# Patient Record
Sex: Female | Born: 1965
Health system: Southern US, Community
[De-identification: ages and names within clinical notes are randomized; demographics above are authoritative.]

## PROBLEM LIST (undated history)

## (undated) DIAGNOSIS — I2510 Atherosclerotic heart disease of native coronary artery without angina pectoris: Secondary | ICD-10-CM

## (undated) DIAGNOSIS — R7303 Prediabetes: Secondary | ICD-10-CM

## (undated) DIAGNOSIS — F32A Depression, unspecified: Secondary | ICD-10-CM

## (undated) DIAGNOSIS — M1990 Unspecified osteoarthritis, unspecified site: Secondary | ICD-10-CM

## (undated) DIAGNOSIS — G4730 Sleep apnea, unspecified: Secondary | ICD-10-CM

## (undated) DIAGNOSIS — I499 Cardiac arrhythmia, unspecified: Secondary | ICD-10-CM

## (undated) DIAGNOSIS — F419 Anxiety disorder, unspecified: Secondary | ICD-10-CM

## (undated) DIAGNOSIS — E119 Type 2 diabetes mellitus without complications: Secondary | ICD-10-CM

## (undated) DIAGNOSIS — I10 Essential (primary) hypertension: Secondary | ICD-10-CM

## (undated) HISTORY — PX: HERNIA REPAIR: SHX51

## (undated) HISTORY — PX: OTHER SURGICAL HISTORY: SHX169

## (undated) HISTORY — PX: BACK SURGERY: SHX140

---

## 1999-11-03 ENCOUNTER — Emergency Department (HOSPITAL_COMMUNITY): Admission: EM | Admit: 1999-11-03 | Discharge: 1999-11-03 | Payer: Self-pay | Admitting: *Deleted

## 2000-05-12 ENCOUNTER — Emergency Department (HOSPITAL_COMMUNITY): Admission: EM | Admit: 2000-05-12 | Discharge: 2000-05-12 | Payer: Self-pay | Admitting: Internal Medicine

## 2002-03-09 ENCOUNTER — Emergency Department (HOSPITAL_COMMUNITY): Admission: EM | Admit: 2002-03-09 | Discharge: 2002-03-09 | Payer: Self-pay | Admitting: Emergency Medicine

## 2002-03-09 ENCOUNTER — Encounter: Payer: Self-pay | Admitting: Emergency Medicine

## 2005-05-15 ENCOUNTER — Emergency Department (HOSPITAL_COMMUNITY): Admission: EM | Admit: 2005-05-15 | Discharge: 2005-05-15 | Payer: Self-pay | Admitting: Emergency Medicine

## 2005-06-10 ENCOUNTER — Ambulatory Visit (HOSPITAL_COMMUNITY): Admission: RE | Admit: 2005-06-10 | Discharge: 2005-06-10 | Payer: Self-pay | Admitting: Chiropractic Medicine

## 2011-01-11 ENCOUNTER — Observation Stay (HOSPITAL_COMMUNITY)
Admission: EM | Admit: 2011-01-11 | Discharge: 2011-01-13 | Disposition: A | Discharge status: Home / Self Care | Payer: Non-veteran care | Attending: Internal Medicine | Admitting: Internal Medicine

## 2011-01-11 ENCOUNTER — Emergency Department (HOSPITAL_COMMUNITY): Payer: Non-veteran care

## 2011-01-11 DIAGNOSIS — E785 Hyperlipidemia, unspecified: Secondary | ICD-10-CM | POA: Insufficient documentation

## 2011-01-11 DIAGNOSIS — M545 Low back pain, unspecified: Secondary | ICD-10-CM | POA: Insufficient documentation

## 2011-01-11 DIAGNOSIS — K5900 Constipation, unspecified: Secondary | ICD-10-CM | POA: Insufficient documentation

## 2011-01-11 DIAGNOSIS — R0789 Other chest pain: Principal | ICD-10-CM | POA: Insufficient documentation

## 2011-01-11 DIAGNOSIS — D509 Iron deficiency anemia, unspecified: Secondary | ICD-10-CM | POA: Insufficient documentation

## 2011-01-11 DIAGNOSIS — J309 Allergic rhinitis, unspecified: Secondary | ICD-10-CM | POA: Insufficient documentation

## 2011-01-11 DIAGNOSIS — R0602 Shortness of breath: Secondary | ICD-10-CM | POA: Insufficient documentation

## 2011-01-11 DIAGNOSIS — E876 Hypokalemia: Secondary | ICD-10-CM | POA: Insufficient documentation

## 2011-01-11 DIAGNOSIS — F329 Major depressive disorder, single episode, unspecified: Secondary | ICD-10-CM | POA: Insufficient documentation

## 2011-01-11 DIAGNOSIS — G4700 Insomnia, unspecified: Secondary | ICD-10-CM | POA: Insufficient documentation

## 2011-01-11 DIAGNOSIS — F3289 Other specified depressive episodes: Secondary | ICD-10-CM | POA: Insufficient documentation

## 2011-01-11 DIAGNOSIS — R002 Palpitations: Secondary | ICD-10-CM | POA: Insufficient documentation

## 2011-01-11 LAB — CBC
HCT: 33.3 % — ABNORMAL LOW (ref 36.0–46.0)
Hemoglobin: 10.7 g/dL — ABNORMAL LOW (ref 12.0–15.0)
MCH: 22.9 pg — ABNORMAL LOW (ref 26.0–34.0)
MCHC: 32.1 g/dL (ref 30.0–36.0)
MCV: 71.3 fL — ABNORMAL LOW (ref 78.0–100.0)
Platelets: 430 10*3/uL — ABNORMAL HIGH (ref 150–400)
RBC: 4.67 MIL/uL (ref 3.87–5.11)
RDW: 17.9 % — ABNORMAL HIGH (ref 11.5–15.5)
WBC: 6.1 10*3/uL (ref 4.0–10.5)

## 2011-01-11 LAB — D-DIMER, QUANTITATIVE: D-Dimer, Quant: 0.49 ug/mL-FEU — ABNORMAL HIGH (ref 0.00–0.48)

## 2011-01-11 LAB — CK TOTAL AND CKMB (NOT AT ARMC)
CK, MB: 1.1 ng/mL (ref 0.3–4.0)
Relative Index: 0.8 (ref 0.0–2.5)
Total CK: 136 U/L (ref 7–177)

## 2011-01-11 LAB — COMPREHENSIVE METABOLIC PANEL
ALT: 16 U/L (ref 0–35)
AST: 21 U/L (ref 0–37)
Albumin: 3.5 g/dL (ref 3.5–5.2)
Alkaline Phosphatase: 107 U/L (ref 39–117)
BUN: 9 mg/dL (ref 6–23)
CO2: 26 mEq/L (ref 19–32)
Calcium: 9.3 mg/dL (ref 8.4–10.5)
Chloride: 105 mEq/L (ref 96–112)
Creatinine, Ser: 0.92 mg/dL (ref 0.4–1.2)
GFR calc Af Amer: >60 mL/min (ref >60)
GFR calc non Af Amer: >60 mL/min (ref >60)
Glucose, Bld: 89 mg/dL (ref 70–99)
Potassium: 3.3 mEq/L — ABNORMAL LOW (ref 3.5–5.1)
Sodium: 137 mEq/L (ref 135–145)
Total Bilirubin: 0.2 mg/dL — ABNORMAL LOW (ref 0.3–1.2)
Total Protein: 7.1 g/dL (ref 6.0–8.3)

## 2011-01-11 LAB — POCT CARDIAC MARKERS
CKMB, poc: <1 ng/mL — ABNORMAL LOW (ref 1.0–8.0)
Myoglobin, poc: 125 ng/mL (ref 12–200)
Troponin i, poc: <0.05 ng/mL (ref 0.00–0.09)

## 2011-01-11 LAB — DIFFERENTIAL
Basophils Absolute: 0 10*3/uL (ref 0.0–0.1)
Basophils Relative: 0 % (ref 0–1)
Eosinophils Absolute: 0.2 10*3/uL (ref 0.0–0.7)
Eosinophils Relative: 4 % (ref 0–5)
Lymphocytes Relative: 31 % (ref 12–46)
Lymphs Abs: 1.9 10*3/uL (ref 0.7–4.0)
Monocytes Absolute: 0.6 10*3/uL (ref 0.1–1.0)
Monocytes Relative: 10 % (ref 3–12)
Neutro Abs: 3.4 10*3/uL (ref 1.7–7.7)
Neutrophils Relative %: 56 % (ref 43–77)

## 2011-01-11 LAB — TROPONIN I: Troponin I: 0.01 ng/mL (ref 0.00–0.06)

## 2011-01-12 LAB — URINALYSIS, ROUTINE W REFLEX MICROSCOPIC
Bilirubin Urine: NEGATIVE
Glucose, UA: NEGATIVE mg/dL
Hgb urine dipstick: NEGATIVE
Ketones, ur: NEGATIVE mg/dL
Nitrite: NEGATIVE
Protein, ur: NEGATIVE mg/dL
Specific Gravity, Urine: 1.012 (ref 1.005–1.030)
Urobilinogen, UA: 1 mg/dL (ref 0.0–1.0)
pH: 6.5 (ref 5.0–8.0)

## 2011-01-12 LAB — URINE MICROSCOPIC-ADD ON

## 2011-01-12 LAB — HEMOGLOBIN A1C
Hgb A1c MFr Bld: 6 % — ABNORMAL HIGH (ref <5.7)
Mean Plasma Glucose: 126 mg/dL — ABNORMAL HIGH (ref <117)

## 2011-01-12 LAB — PROTIME-INR
INR: 1.04 (ref 0.00–1.49)
Prothrombin Time: 13.8 seconds (ref 11.6–15.2)

## 2011-01-12 LAB — CBC
HCT: 31.9 % — ABNORMAL LOW (ref 36.0–46.0)
Hemoglobin: 10.1 g/dL — ABNORMAL LOW (ref 12.0–15.0)
MCH: 22.5 pg — ABNORMAL LOW (ref 26.0–34.0)
MCHC: 31.7 g/dL (ref 30.0–36.0)
MCV: 71 fL — ABNORMAL LOW (ref 78.0–100.0)
Platelets: 391 10*3/uL (ref 150–400)
RBC: 4.49 MIL/uL (ref 3.87–5.11)
RDW: 17.9 % — ABNORMAL HIGH (ref 11.5–15.5)
WBC: 6.1 10*3/uL (ref 4.0–10.5)

## 2011-01-12 LAB — LIPID PANEL
Cholesterol: 227 mg/dL — ABNORMAL HIGH (ref 0–200)
HDL: 48 mg/dL (ref >39)
LDL Cholesterol: 122 mg/dL — ABNORMAL HIGH (ref 0–99)
Total CHOL/HDL Ratio: 4.7 RATIO
Triglycerides: 283 mg/dL — ABNORMAL HIGH (ref <150)
VLDL: 57 mg/dL — ABNORMAL HIGH (ref 0–40)

## 2011-01-12 LAB — CK TOTAL AND CKMB (NOT AT ARMC)
CK, MB: 1.7 ng/mL (ref 0.3–4.0)
CK, MB: 2 ng/mL (ref 0.3–4.0)
Relative Index: 1 (ref 0.0–2.5)
Relative Index: 1.1 (ref 0.0–2.5)
Total CK: 165 U/L (ref 7–177)
Total CK: 187 U/L — ABNORMAL HIGH (ref 7–177)

## 2011-01-12 LAB — MAGNESIUM: Magnesium: 2.1 mg/dL (ref 1.5–2.5)

## 2011-01-12 LAB — BASIC METABOLIC PANEL
BUN: 8 mg/dL (ref 6–23)
CO2: 27 mEq/L (ref 19–32)
Calcium: 8.9 mg/dL (ref 8.4–10.5)
Chloride: 105 mEq/L (ref 96–112)
Creatinine, Ser: 1 mg/dL (ref 0.4–1.2)
GFR calc Af Amer: >60 mL/min (ref >60)
GFR calc non Af Amer: 60 mL/min — ABNORMAL LOW (ref >60)
Glucose, Bld: 103 mg/dL — ABNORMAL HIGH (ref 70–99)
Potassium: 3.2 mEq/L — ABNORMAL LOW (ref 3.5–5.1)
Sodium: 137 mEq/L (ref 135–145)

## 2011-01-12 LAB — TSH: TSH: 3.611 u[IU]/mL (ref 0.350–4.500)

## 2011-01-12 LAB — TROPONIN I
Troponin I: 0.01 ng/mL (ref 0.00–0.06)
Troponin I: 0.01 ng/mL (ref 0.00–0.06)

## 2011-01-12 LAB — PHOSPHORUS: Phosphorus: 3.1 mg/dL (ref 2.3–4.6)

## 2011-01-12 LAB — APTT: aPTT: 32 seconds (ref 24–37)

## 2011-01-13 LAB — BASIC METABOLIC PANEL
BUN: 6 mg/dL (ref 6–23)
CO2: 27 mEq/L (ref 19–32)
Calcium: 9.3 mg/dL (ref 8.4–10.5)
Chloride: 105 mEq/L (ref 96–112)
Creatinine, Ser: 0.86 mg/dL (ref 0.4–1.2)
GFR calc Af Amer: >60 mL/min (ref >60)
GFR calc non Af Amer: >60 mL/min (ref >60)
Glucose, Bld: 91 mg/dL (ref 70–99)
Potassium: 4.5 mEq/L (ref 3.5–5.1)
Sodium: 136 mEq/L (ref 135–145)

## 2011-01-29 NOTE — H&P (Signed)
NAME:  Holly Cook, Holly Cook                 ACCOUNT NO.:  [id number]  MEDICAL RECORD NO.:  [id number]           PATIENT TYPE:  E  LOCATION:  WLED                         FACILITY[facility]  [facility]  PHYSICIAN:  Michiel Cowboy, MDDATE OF BIRTH:  24-Jan-1966  DATE OF ADMISSION:  01/11/2011 DATE OF DISCHARGE:                             HISTORY & PHYSICAL   PRIMARY CARE PHYSICIAN:  The goes to [place].  CHIEF COMPLAINT:  Chest pain, palpitations and back pain.  HISTORY OF PRESENT ILLNESS:  The patient is a 45 year old female who today developed intermittent chest pains feeling like twinges in her chest each lasting a couple of minutes, but a few presenting in the row as well as associated some palpitations.  This was also associated with minimal shortness of breath, mild nausea, otherwise no diaphoresis. This was nonexertional, nonpleuritic.  Currently, the patient is completely chest pain-free and asymptomatic.  She also reports having today lower back pain on the left.  She denies lifting anything and this pain has been relieved with morphine.  Otherwise, no other complaints. The patient is chest pain-free apparently currently.  REVIEW OF SYSTEMS:  Unremarkable.  PAST MEDICAL HISTORY:  Significant for cervical versus uterine abnormality which the patient is not sure of, followed by [facility] for which she is on hormonal therapy.  ALLERGIES:  She has no allergies.  SOCIAL HISTORY:  The patient does not smoke or drink, never does.  Does not abuse drugs.  FAMILY HISTORY:  Significant for mother with lung cancer who was a heavy smoker.  ALLERGIES:  None.  MEDICATIONS:  Vitamin D and some analgesia and some hormonal medication which she does not know what the name of that is but thinks this is not birth control pill.  PHYSICAL EXAMINATION:  VITALS:  Temperature 98.4, blood pressure 122/81, pulse 72, respiration 19, satting 97% on room air. GENERAL:  The patient appears to be in  no acute distress.  Head nontraumatic.  Moist mucous membranes. LUNGS:  Clear to auscultation bilaterally. HEART:  Regular rate and rhythm.  No murmurs appreciated. ABDOMEN:  Soft, nontender, nondistended.  Lower extremities without clubbing, cyanosis or edema.  NEUROLOGICALLY:  Grossly intact. SKIN:  Clean, dry and intact.  LABORATORY DATA:  White blood cell count 6.1, hemoglobin 10.7.  Sodium 137, potassium 3.3, creatinine 0.92.  LFTs within normal limits. Cardiac markers within normal limits.  Chest x-ray unremarkable.  EKG does show T-wave inversion in multiple leads including I, II, III, aVF, V1 through V6.  She does also have some S waves in lead III.  ASSESSMENT/PLAN:  This is a 45 year old female with atypical chest pain and slightly abnormal EKG with some palpitations and possible chest pain associated with shortness of breath. 1. Chest pain, this is very atypical but given that we do not have an     old EKG to compare, we will admit for observation, cycle cardiac     enzymes, further risk stratify fasting lipid panel, hemoglobin A1c,     serial EKG.  We will check a D-dimer given some EKG changes     associated with shortness of  breath with chest pain, although her     chest pain is very atypical for a PE as well.  The patient is     undergoing some kind of hormone replacement or some kind of     hormonal therapy which can also put her at slightly increased risk     for PEs.  We will need to clarify what that is. 2. Slightly low potassium, replaced. 3. Prophylaxis, Lovenox and good p.o. intake.     Michiel Cowboy, MD     AVD/MEDQ  D:  01/11/2011  T:  01/11/2011  Job:  [id number]  cc:   Renne Musca  Electronically Signed by Therisa Doyne MD on 01/29/2011 02:38:58 AM

## 2011-08-15 ENCOUNTER — Emergency Department (HOSPITAL_COMMUNITY)
Admission: EM | Admit: 2011-08-15 | Discharge: 2011-08-15 | Disposition: A | Discharge status: Home / Self Care | Payer: Non-veteran care | Attending: Emergency Medicine | Admitting: Emergency Medicine

## 2011-08-15 ENCOUNTER — Emergency Department (HOSPITAL_COMMUNITY): Payer: Non-veteran care

## 2011-08-15 DIAGNOSIS — N200 Calculus of kidney: Secondary | ICD-10-CM | POA: Insufficient documentation

## 2011-08-15 LAB — URINALYSIS, ROUTINE W REFLEX MICROSCOPIC
Bilirubin Urine: NEGATIVE
Glucose, UA: NEGATIVE mg/dL
Ketones, ur: NEGATIVE mg/dL
Nitrite: NEGATIVE
Protein, ur: NEGATIVE mg/dL
Specific Gravity, Urine: 1.019 (ref 1.005–1.030)
Urobilinogen, UA: 1 mg/dL (ref 0.0–1.0)
pH: 7.5 (ref 5.0–8.0)

## 2011-08-15 LAB — BASIC METABOLIC PANEL
BUN: 11 mg/dL (ref 6–23)
CO2: 25 mEq/L (ref 19–32)
Calcium: 9.4 mg/dL (ref 8.4–10.5)
Chloride: 103 mEq/L (ref 96–112)
Creatinine, Ser: 1.27 mg/dL — ABNORMAL HIGH (ref 0.50–1.10)
GFR calc Af Amer: 58 mL/min — ABNORMAL LOW (ref >90)
GFR calc non Af Amer: 50 mL/min — ABNORMAL LOW (ref >90)
Glucose, Bld: 108 mg/dL — ABNORMAL HIGH (ref 70–99)
Potassium: 4 mEq/L (ref 3.5–5.1)
Sodium: 138 mEq/L (ref 135–145)

## 2011-08-15 LAB — URINE MICROSCOPIC-ADD ON

## 2011-08-15 LAB — CBC
HCT: 34.1 % — ABNORMAL LOW (ref 36.0–46.0)
Hemoglobin: 11.7 g/dL — ABNORMAL LOW (ref 12.0–15.0)
MCH: 24.9 pg — ABNORMAL LOW (ref 26.0–34.0)
MCHC: 34.3 g/dL (ref 30.0–36.0)
MCV: 72.6 fL — ABNORMAL LOW (ref 78.0–100.0)
Platelets: 417 10*3/uL — ABNORMAL HIGH (ref 150–400)
RBC: 4.7 MIL/uL (ref 3.87–5.11)
RDW: 17.7 % — ABNORMAL HIGH (ref 11.5–15.5)
WBC: 7.3 10*3/uL (ref 4.0–10.5)

## 2011-08-15 LAB — DIFFERENTIAL
Basophils Absolute: 0 10*3/uL (ref 0.0–0.1)
Basophils Relative: 0 % (ref 0–1)
Eosinophils Absolute: 0.5 10*3/uL (ref 0.0–0.7)
Eosinophils Relative: 6 % — ABNORMAL HIGH (ref 0–5)
Lymphocytes Relative: 21 % (ref 12–46)
Lymphs Abs: 1.6 10*3/uL (ref 0.7–4.0)
Monocytes Absolute: 0.5 10*3/uL (ref 0.1–1.0)
Monocytes Relative: 6 % (ref 3–12)
Neutro Abs: 4.8 10*3/uL (ref 1.7–7.7)
Neutrophils Relative %: 66 % (ref 43–77)

## 2011-08-15 LAB — POCT PREGNANCY, URINE: Preg Test, Ur: NEGATIVE

## 2011-08-24 ENCOUNTER — Emergency Department (HOSPITAL_COMMUNITY)
Admission: EM | Admit: 2011-08-24 | Discharge: 2011-08-25 | Disposition: A | Discharge status: Home / Self Care | Payer: Non-veteran care | Attending: Emergency Medicine | Admitting: Emergency Medicine

## 2011-08-24 DIAGNOSIS — R109 Unspecified abdominal pain: Secondary | ICD-10-CM | POA: Insufficient documentation

## 2011-08-24 DIAGNOSIS — N72 Inflammatory disease of cervix uteri: Secondary | ICD-10-CM | POA: Insufficient documentation

## 2011-08-24 DIAGNOSIS — K5900 Constipation, unspecified: Secondary | ICD-10-CM | POA: Insufficient documentation

## 2011-08-25 ENCOUNTER — Emergency Department (HOSPITAL_COMMUNITY): Payer: Non-veteran care

## 2011-08-25 LAB — POCT I-STAT, CHEM 8
BUN: 7 mg/dL (ref 6–23)
Calcium, Ion: 1.17 mmol/L (ref 1.12–1.32)
Chloride: 104 mEq/L (ref 96–112)
Creatinine, Ser: 1.1 mg/dL (ref 0.50–1.10)
Glucose, Bld: 101 mg/dL — ABNORMAL HIGH (ref 70–99)
HCT: 40 % (ref 36.0–46.0)
Hemoglobin: 13.6 g/dL (ref 12.0–15.0)
Potassium: 3.9 mEq/L (ref 3.5–5.1)
Sodium: 138 mEq/L (ref 135–145)
TCO2: 24 mmol/L (ref 0–100)

## 2011-08-25 LAB — CBC
HCT: 34.9 % — ABNORMAL LOW (ref 36.0–46.0)
Hemoglobin: 11.5 g/dL — ABNORMAL LOW (ref 12.0–15.0)
MCH: 24.2 pg — ABNORMAL LOW (ref 26.0–34.0)
MCHC: 33 g/dL (ref 30.0–36.0)
MCV: 73.3 fL — ABNORMAL LOW (ref 78.0–100.0)
Platelets: 415 10*3/uL — ABNORMAL HIGH (ref 150–400)
RBC: 4.76 MIL/uL (ref 3.87–5.11)
RDW: 17.8 % — ABNORMAL HIGH (ref 11.5–15.5)
WBC: 9.3 10*3/uL (ref 4.0–10.5)

## 2011-08-25 LAB — DIFFERENTIAL
Basophils Absolute: 0 10*3/uL (ref 0.0–0.1)
Basophils Relative: 0 % (ref 0–1)
Eosinophils Absolute: 0.5 10*3/uL (ref 0.0–0.7)
Eosinophils Relative: 6 % — ABNORMAL HIGH (ref 0–5)
Lymphocytes Relative: 20 % (ref 12–46)
Lymphs Abs: 1.9 10*3/uL (ref 0.7–4.0)
Monocytes Absolute: 0.8 10*3/uL (ref 0.1–1.0)
Monocytes Relative: 9 % (ref 3–12)
Neutro Abs: 6.1 10*3/uL (ref 1.7–7.7)
Neutrophils Relative %: 65 % (ref 43–77)

## 2011-08-25 LAB — WET PREP, GENITAL
Trich, Wet Prep: NONE SEEN
Yeast Wet Prep HPF POC: NONE SEEN

## 2011-08-25 LAB — URINALYSIS, ROUTINE W REFLEX MICROSCOPIC
Bilirubin Urine: NEGATIVE
Glucose, UA: NEGATIVE mg/dL
Ketones, ur: NEGATIVE mg/dL
Leukocytes, UA: NEGATIVE
Nitrite: NEGATIVE
Protein, ur: NEGATIVE mg/dL
Specific Gravity, Urine: 1.017 (ref 1.005–1.030)
Urobilinogen, UA: 1 mg/dL (ref 0.0–1.0)
pH: 6 (ref 5.0–8.0)

## 2011-08-25 LAB — URINE MICROSCOPIC-ADD ON

## 2011-08-25 LAB — POCT PREGNANCY, URINE: Preg Test, Ur: NEGATIVE

## 2011-08-26 LAB — GC/CHLAMYDIA PROBE AMP, GENITAL
Chlamydia, DNA Probe: NEGATIVE
GC Probe Amp, Genital: NEGATIVE

## 2012-01-11 ENCOUNTER — Encounter (HOSPITAL_COMMUNITY): Payer: Self-pay | Admitting: *Deleted

## 2012-01-11 ENCOUNTER — Emergency Department (HOSPITAL_COMMUNITY)
Admission: EM | Admit: 2012-01-11 | Discharge: 2012-01-11 | Disposition: A | Discharge status: Home / Self Care | Payer: Non-veteran care | Attending: Emergency Medicine | Admitting: Emergency Medicine

## 2012-01-11 DIAGNOSIS — T50A95A Adverse effect of other bacterial vaccines, initial encounter: Secondary | ICD-10-CM | POA: Insufficient documentation

## 2012-01-11 DIAGNOSIS — M25519 Pain in unspecified shoulder: Secondary | ICD-10-CM | POA: Insufficient documentation

## 2012-01-11 DIAGNOSIS — M7989 Other specified soft tissue disorders: Secondary | ICD-10-CM | POA: Insufficient documentation

## 2012-01-11 MED ORDER — CEPHALEXIN 500 MG PO CAPS: 500.0000 mg | ORAL_CAPSULE | Freq: Four times a day (QID) | ORAL | Status: AC

## 2012-01-11 MED ORDER — HYDROCODONE-ACETAMINOPHEN 5-325 MG PO TABS: 1.0000 | ORAL_TABLET | Freq: Four times a day (QID) | ORAL | Status: AC | PRN

## 2012-01-11 NOTE — ED Notes (Signed)
[facility]  She has had redness and swelling to the lt arm injection site.  Painful red

## 2012-01-11 NOTE — ED Provider Notes (Signed)
History     CSN: [id number]  Arrival date & time 01/11/12  Paulo Fruit   First MD Initiated Contact with Patient 01/11/12 2151      Chief Complaint  Patient presents with  . injection reaction     HPI Patient presents to the emergency room with complaints of left shoulder pain associated with a tetanus injection. Patient received a shot on Thursday at the [place]. Patient has developed swelling and tenderness at the site. Patient became concerned that she started having chills, and myalgias. She has felt feverish as well. The site has continued to be very swollen and tender. She describes the pain as a 9/10. It increases with palpation and movement. History reviewed. No pertinent past medical history.  History reviewed. No pertinent past surgical history.  History reviewed. No pertinent family history.  History  Substance Use Topics  . Smoking status: Never Smoker   . Smokeless tobacco: Not on file  . Alcohol Use: No    OB History    Grav Para Term Preterm Abortions TAB SAB Ect Mult Living                  Review of Systems  All other systems reviewed and are negative.    Allergies  Tetanus toxoids  Home Medications  No current outpatient prescriptions on file.  BP 124/86  Pulse 80  Temp(Src) 98.4 F (36.9 C) (Oral)  Resp 22  SpO2 98%  Physical Exam  Nursing note and vitals reviewed. Constitutional: She appears well-developed and well-nourished. No distress.  HENT:  Head: Normocephalic and atraumatic.  Right Ear: External ear normal.  Left Ear: External ear normal.  Eyes: Conjunctivae are normal. Right eye exhibits no discharge. Left eye exhibits no discharge. No scleral icterus.  Neck: Neck supple. No tracheal deviation present.  Cardiovascular: Normal rate.   Pulmonary/Chest: Effort normal. No stridor. No respiratory distress.  Musculoskeletal: She exhibits edema and tenderness.       Left shoulder: She exhibits tenderness and swelling. She exhibits no bony  tenderness, no crepitus and normal pulse.       Indurated area left deltoid area, erythema, no lymphangitic streaking  Neurological: She is alert. Cranial nerve deficit: no gross deficits.  Skin: Skin is warm and dry. No rash noted.  Psychiatric: She has a normal mood and affect.    ED Course  Procedures (including critical care time)  Labs Reviewed - No data to display No results found.    MDM  I doubt a true allergic reaction associated with her tetanus shot.  I suspect she primarily is having a significant localized reaction. It has been 5 days since the injection. Overall infection is low however with her complaints of chills and do not feel that there is any significant harm and started on a course of Keflex. I will prescribe pain medications I recommended she continue warm compresses.        Celene Kras, MD 01/11/12 [phone]

## 2012-01-11 NOTE — ED Notes (Signed)
Pt instructed on observing s/s of infection and enlargement of the wound bed.

## 2012-05-11 ENCOUNTER — Emergency Department (HOSPITAL_COMMUNITY)
Admission: EM | Admit: 2012-05-11 | Discharge: 2012-05-12 | Disposition: A | Discharge status: Home / Self Care | Payer: Non-veteran care | Attending: Emergency Medicine | Admitting: Emergency Medicine

## 2012-05-11 ENCOUNTER — Encounter (HOSPITAL_COMMUNITY): Payer: Self-pay | Admitting: Emergency Medicine

## 2012-05-11 DIAGNOSIS — M549 Dorsalgia, unspecified: Secondary | ICD-10-CM | POA: Insufficient documentation

## 2012-05-11 DIAGNOSIS — R29898 Other symptoms and signs involving the musculoskeletal system: Secondary | ICD-10-CM | POA: Insufficient documentation

## 2012-05-11 DIAGNOSIS — M48061 Spinal stenosis, lumbar region without neurogenic claudication: Secondary | ICD-10-CM

## 2012-05-11 DIAGNOSIS — R209 Unspecified disturbances of skin sensation: Secondary | ICD-10-CM | POA: Insufficient documentation

## 2012-05-11 LAB — CBC WITH DIFFERENTIAL/PLATELET
Basophils Absolute: 0 10*3/uL (ref 0.0–0.1)
Basophils Relative: 0 % (ref 0–1)
Eosinophils Absolute: 0.2 10*3/uL (ref 0.0–0.7)
Eosinophils Relative: 2 % (ref 0–5)
HCT: 37 % (ref 36.0–46.0)
Hemoglobin: 12.7 g/dL (ref 12.0–15.0)
Lymphocytes Relative: 25 % (ref 12–46)
Lymphs Abs: 1.9 10*3/uL (ref 0.7–4.0)
MCH: 25.9 pg — ABNORMAL LOW (ref 26.0–34.0)
MCHC: 34.3 g/dL (ref 30.0–36.0)
MCV: 75.5 fL — ABNORMAL LOW (ref 78.0–100.0)
Monocytes Absolute: 0.5 10*3/uL (ref 0.1–1.0)
Monocytes Relative: 6 % (ref 3–12)
Neutro Abs: 5.3 10*3/uL (ref 1.7–7.7)
Neutrophils Relative %: 67 % (ref 43–77)
Platelets: 403 10*3/uL — ABNORMAL HIGH (ref 150–400)
RBC: 4.9 MIL/uL (ref 3.87–5.11)
RDW: 16.2 % — ABNORMAL HIGH (ref 11.5–15.5)
WBC: 7.8 10*3/uL (ref 4.0–10.5)

## 2012-05-11 LAB — COMPREHENSIVE METABOLIC PANEL
ALT: 31 U/L (ref 0–35)
AST: 29 U/L (ref 0–37)
Albumin: 3.7 g/dL (ref 3.5–5.2)
Alkaline Phosphatase: 142 U/L — ABNORMAL HIGH (ref 39–117)
BUN: 11 mg/dL (ref 6–23)
CO2: 22 mEq/L (ref 19–32)
Calcium: 9.6 mg/dL (ref 8.4–10.5)
Chloride: 103 mEq/L (ref 96–112)
Creatinine, Ser: 0.95 mg/dL (ref 0.50–1.10)
GFR calc Af Amer: 81 mL/min — ABNORMAL LOW (ref >90)
GFR calc non Af Amer: 70 mL/min — ABNORMAL LOW (ref >90)
Glucose, Bld: 102 mg/dL — ABNORMAL HIGH (ref 70–99)
Potassium: 3.5 mEq/L (ref 3.5–5.1)
Sodium: 137 mEq/L (ref 135–145)
Total Bilirubin: 0.3 mg/dL (ref 0.3–1.2)
Total Protein: 7.7 g/dL (ref 6.0–8.3)

## 2012-05-11 MED ORDER — SODIUM CHLORIDE 0.9 % IV SOLN
Freq: Once | INTRAVENOUS | Status: AC
  Administered 2012-05-11: 22:00:00 via INTRAVENOUS

## 2012-05-11 NOTE — ED Notes (Signed)
XBJ:[id number]<NF> Expected date:05/11/12<BR> Expected time: 9:12 PM<BR> Means of arrival:Ambulance<BR> Comments:<BR> Leg weakness

## 2012-05-11 NOTE — ED Notes (Signed)
Brought in by EMS from home with c/o generalized weakness. Per EMS, pt has been having "tingling and numbness" in her lower extremities at least once a day for the past weeks, pt goes to [facility], pt has an appointment for MRI next week but she was advised that if her symptoms worsen, she needs to go to ED for evaluation. Pt reports that she has been having weakness more frequently--- today, when she was at her church, her legs suddenly got weak and almost fell--- took a rest and the "feeling" went away.

## 2012-05-12 ENCOUNTER — Emergency Department (HOSPITAL_COMMUNITY): Payer: Non-veteran care

## 2012-05-12 MED ORDER — OXYCODONE-ACETAMINOPHEN 10-325 MG PO TABS: 1.0000 | ORAL_TABLET | ORAL | Status: AC | PRN

## 2012-05-12 MED ORDER — DICLOFENAC SODIUM 75 MG PO TBEC
75.0000 mg | DELAYED_RELEASE_TABLET | Freq: Once | ORAL | Status: AC
  Administered 2012-05-12: 75 mg via ORAL
  Filled 2012-05-12: qty 1

## 2012-05-12 MED ORDER — CYCLOBENZAPRINE HCL 10 MG PO TABS
10.0000 mg | ORAL_TABLET | Freq: Once | ORAL | Status: AC
  Administered 2012-05-12: 10 mg via ORAL
  Filled 2012-05-12: qty 1

## 2012-05-12 MED ORDER — MORPHINE SULFATE 4 MG/ML IJ SOLN
4.0000 mg | Freq: Once | INTRAMUSCULAR | Status: AC
  Administered 2012-05-12: 4 mg via INTRAVENOUS
  Filled 2012-05-12: qty 1

## 2012-05-12 NOTE — ED Provider Notes (Signed)
BP 123/78  Pulse 69  Temp 98 F (36.7 C) (Oral)  Resp 12  SpO2 97%   Severe lumbar stenosis noted on MRI. Strength 5/5 and not c/o numbness at this time. Ambulatory in ED. D/W Dr. Venetia Maxon. Pain control, will f/u in office for surgical eval. No EMC precluding discharge at this time. Given Precautions for return. PMD f/u.   Forbes Cellar, MD 05/12/12 [phone]

## 2012-05-12 NOTE — ED Notes (Signed)
Patient transported to MRI 

## 2012-05-12 NOTE — ED Provider Notes (Signed)
History     CSN: [id number]  Arrival date & time 05/11/12  2111   First MD Initiated Contact with Patient 05/11/12 2300      Chief Complaint  Patient presents with  . Weakness    (Consider location/radiation/quality/duration/timing/severity/associated sxs/prior treatment) Patient is a 46 y.o. female presenting with weakness.  Weakness  Additional symptoms include weakness.   Patient is a 46 year old female who presents today complaining of one month of increasing frequency of episodes of bilateral lower extremity weakness and numbness. She has had some back pain and has history of a bulging disc but denies any acute injuries. She reports that her symptoms are always bilateral. She has been evaluated at the [place] where she normally follows up with a physician. While an MRI has been discussed none has been performed at this time. Patient reports the last time she was checked they felt that she was weaker in her right leg than her left. Patient says she's been having increasing episodes where her legs will not support her. These have never been accompanied by incontinence. Patient does describe numbness with these. She feels that the pain begins in her hips and radiates inferiorly. She has never had any numbness above the level of the umbilicus. Patient has no history of thromboembolic disease. There no other associated or modifying factors. History reviewed. No pertinent past medical history.  History reviewed. No pertinent past surgical history.  History reviewed. No pertinent family history.  History  Substance Use Topics  . Smoking status: Never Smoker   . Smokeless tobacco: Not on file  . Alcohol Use: No    OB History    Grav Para Term Preterm Abortions TAB SAB Ect Mult Living                  Review of Systems  HENT: Negative.   Eyes: Negative.   Respiratory: Negative.   Cardiovascular: Negative.   Gastrointestinal: Negative.   Genitourinary: Negative.     Musculoskeletal: Positive for back pain.  Skin: Negative.   Neurological: Positive for weakness and numbness.  Hematological: Negative.   Psychiatric/Behavioral: Negative.   All other systems reviewed and are negative.    Allergies  Tetanus toxoids  Home Medications   Current Outpatient Rx  Name Route Sig Dispense Refill  . VITAMIN E 400 UNITS PO CAPS Oral Take 400 Units by mouth daily.      BP 105/69  Pulse 74  Temp 98.1 F (36.7 C) (Oral)  Resp 13  SpO2 99%  Physical Exam  Nursing note and vitals reviewed. GEN: Well-developed, well-nourished female in no distress HEENT: Atraumatic, normocephalic. Oropharynx clear without erythema EYES: PERRLA BL, no scleral icterus. NECK: Trachea midline, no meningismus CV: regular rate and rhythm. No murmurs, rubs, or gallops PULM: No respiratory distress.  No crackles, wheezes, or rales. GI: soft, non-tender. No guarding, rebound, or tenderness. + bowel sounds  GU: deferred Neuro: cranial nerves grossly 2-12 intact, no abnormalities of strength or sensation, A and O x 3 MSK: Patient moves all 4 extremities symmetrically, no deformity, edema, or injury noted, patient is able to stand but reports that she feels weak. Skin: No rashes petechiae, purpura, or jaundice Psych: no abnormality of mood   ED Course  Procedures (including critical care time)  Labs Reviewed  CBC WITH DIFFERENTIAL - Abnormal; Notable for the following:    MCV 75.5 (*)     MCH 25.9 (*)     RDW 16.2 (*)     Platelets  403 (*)     All other components within normal limits  COMPREHENSIVE METABOLIC PANEL - Abnormal; Notable for the following:    Glucose, Bld 102 (*)     Alkaline Phosphatase 142 (*)     GFR calc non Af Amer 70 (*)     GFR calc Af Amer 81 (*)     All other components within normal limits   No results found.   1. Lower extremity weakness   2. Lower extremity numbness       MDM  Patient was evaluated by myself. Based on presentation  patient was not currently having any neurologic symptoms. She did report that she was having increasing episodes of weakness and bilateral lower shiny numbness. Patient had some back pain and bulging disc in the past. Patient denies any fevers, and all pain, urinary symptoms, history of thromboembolic disease, or other factors related to this. Patient did not 1 any pain medication other than her normal level tear and. Patient also takes Flexeril. She was given a dose of both of these. Patient had CBC and renal panel ordered per nursing with no significant abnormalities. Given the increasing frequency of these strange episodes patient did have MRI of thoracic and lumbar spine ordered. This is pending at this time. Should these return within normal limits patient can followup with go for neurological for further management. Any antibiotics will be followed up by the oncoming attending.        Cyndra Numbers, MD 05/12/12 0900

## 2012-05-12 NOTE — ED Notes (Signed)
Pt still in MRI 

## 2012-05-12 NOTE — ED Notes (Signed)
Pt getting dressed. Ready for discharge 

## 2012-07-24 ENCOUNTER — Encounter (HOSPITAL_COMMUNITY): Payer: Self-pay | Admitting: *Deleted

## 2012-07-24 ENCOUNTER — Inpatient Hospital Stay (HOSPITAL_COMMUNITY): Payer: Non-veteran care

## 2012-07-24 ENCOUNTER — Inpatient Hospital Stay (HOSPITAL_COMMUNITY)
Admission: AD | Admit: 2012-07-24 | Discharge: 2012-07-24 | Disposition: A | Discharge status: Home / Self Care | Payer: Non-veteran care | Source: Ambulatory Visit | Attending: Obstetrics and Gynecology | Admitting: Obstetrics and Gynecology

## 2012-07-24 DIAGNOSIS — N950 Postmenopausal bleeding: Secondary | ICD-10-CM

## 2012-07-24 DIAGNOSIS — D259 Leiomyoma of uterus, unspecified: Secondary | ICD-10-CM | POA: Insufficient documentation

## 2012-07-24 LAB — CBC
HCT: 36.6 % (ref 36.0–46.0)
Hemoglobin: 12.4 g/dL (ref 12.0–15.0)
MCH: 25.8 pg — ABNORMAL LOW (ref 26.0–34.0)
MCHC: 33.9 g/dL (ref 30.0–36.0)
MCV: 76.3 fL — ABNORMAL LOW (ref 78.0–100.0)
Platelets: 407 10*3/uL — ABNORMAL HIGH (ref 150–400)
RBC: 4.8 MIL/uL (ref 3.87–5.11)
RDW: 16 % — ABNORMAL HIGH (ref 11.5–15.5)
WBC: 7.2 10*3/uL (ref 4.0–10.5)

## 2012-07-24 LAB — WET PREP, GENITAL
Clue Cells Wet Prep HPF POC: NONE SEEN
Trich, Wet Prep: NONE SEEN
Yeast Wet Prep HPF POC: NONE SEEN

## 2012-07-24 LAB — POCT PREGNANCY, URINE: Preg Test, Ur: NEGATIVE

## 2012-07-24 MED ORDER — MEGESTROL ACETATE 40 MG PO TABS: ORAL_TABLET | ORAL | Status: DC

## 2012-07-24 MED ORDER — KETOROLAC TROMETHAMINE 60 MG/2ML IM SOLN
60.0000 mg | Freq: Once | INTRAMUSCULAR | Status: AC
  Administered 2012-07-24: 60 mg via INTRAMUSCULAR
  Filled 2012-07-24: qty 2

## 2012-07-24 MED ORDER — HYDROCODONE-ACETAMINOPHEN 5-325 MG PO TABS: 1.0000 | ORAL_TABLET | Freq: Four times a day (QID) | ORAL | Status: DC | PRN

## 2012-07-24 NOTE — MAU Note (Signed)
Pt reports she started having vaginal bleeding at 3am this morning. Pt has not had a period for 2 years(menapause).  Reports having menstral like cramping and pain.

## 2012-07-24 NOTE — MAU Provider Note (Signed)
History     CSN: [id number]  Arrival date and time: 07/24/12 1118   None     Chief Complaint  Patient presents with  . Vaginal Bleeding   HPI 46 y.o. [id number] with heavy vaginal bleeding since 3 am this morning. Pt states she is postmenopausal, has not had any vaginal bleeding x 2 years. Crampy low abd pain.     History reviewed. No pertinent past medical history.  Past Surgical History  Procedure Date  . Wrist and arm surgery   . Hernia repair     History reviewed. No pertinent family history.  History  Substance Use Topics  . Smoking status: Never Smoker   . Smokeless tobacco: Not on file  . Alcohol Use: No    Allergies:  Allergies  Allergen Reactions  . Tetanus Toxoids Other (See Comments)    Flu-like symptoms, injection site reaction; treated with antibiotics.  Not certain if reaction to vaccine or infection of injection site    No prescriptions prior to admission    Review of Systems  Constitutional: Negative.   Respiratory: Negative.   Cardiovascular: Negative.   Gastrointestinal: Positive for abdominal pain. Negative for nausea, vomiting, diarrhea and constipation.  Genitourinary: Negative for dysuria, urgency, frequency, hematuria and flank pain.       Positive for vaginal bleeding  Musculoskeletal: Negative.   Neurological: Negative.   Psychiatric/Behavioral: Negative.    Physical Exam   Blood pressure 135/94, pulse 67, temperature 97.1 F (36.2 C), temperature source Oral, resp. rate 18.  Physical Exam  Constitutional: She is oriented to person, place, and time. She appears well-developed and well-nourished. No distress.  HENT:  Head: Normocephalic and atraumatic.  Cardiovascular: Normal rate and regular rhythm.   Respiratory: Effort normal. No respiratory distress.  GI: Soft. She exhibits no distension and no mass. There is no tenderness. There is no rebound and no guarding.  Genitourinary: There is no rash or lesion on the right labia.  There is no rash or lesion on the left labia. Uterus is enlarged and tender. Uterus is not deviated and not fixed. Cervix exhibits no motion tenderness, no discharge and no friability. Right adnexum displays no mass, no tenderness and no fullness. Left adnexum displays no mass, no tenderness and no fullness. There is bleeding (moderate) around the vagina. No erythema or tenderness around the vagina. No vaginal discharge found.  Neurological: She is alert and oriented to person, place, and time.  Skin: Skin is warm and dry.  Psychiatric: She has a normal mood and affect.    MAU Course  Procedures Results for orders placed during the hospital encounter of 07/24/12 (from the past 24 hour(s))  POCT PREGNANCY, URINE     Status: Normal   Collection Time   07/24/12 11:49 AM      Component Value Range   Preg Test, Ur NEGATIVE  NEGATIVE  WET PREP, GENITAL     Status: Abnormal   Collection Time   07/24/12 12:00 PM      Component Value Range   Yeast Wet Prep HPF POC NONE SEEN  NONE SEEN   Trich, Wet Prep NONE SEEN  NONE SEEN   Clue Cells Wet Prep HPF POC NONE SEEN  NONE SEEN   WBC, Wet Prep HPF POC FEW (*) NONE SEEN  CBC     Status: Abnormal   Collection Time   07/24/12 12:27 PM      Component Value Range   WBC 7.2  4.0 - 10.5 K/uL  RBC 4.80  3.87 - 5.11 MIL/uL   Hemoglobin 12.4  12.0 - 15.0 g/dL   HCT 16.1  09.6 - 04.5 %   MCV 76.3 (*) 78.0 - 100.0 fL   MCH 25.8 (*) 26.0 - 34.0 pg   MCHC 33.9  30.0 - 36.0 g/dL   RDW 40.9 (*) 81.1 - 91.4 %   Platelets 407 (*) 150 - 400 K/uL   US Transvaginal Non-ob  07/24/2012  *RADIOLOGY REPORT*  Clinical Data: Post menopausal bleeding and pain.  TRANSABDOMINAL AND TRANSVAGINAL ULTRASOUND OF PELVIS Technique:  Both transabdominal and transvaginal ultrasound examinations of the pelvis were performed. Transabdominal technique was performed for global imaging of the pelvis including uterus, ovaries, adnexal regions, and pelvic cul-de-sac.  It was necessary to  proceed with endovaginal exam following the transabdominal exam to visualize the uterus and endometrium.  Comparison:  No priors.  Findings:  Uterus: The uterus is enlarged and heterogeneous in appearance measuring 11.0 x 6.5 x 8.4 cm.  There are multiple focal lesions of heterogeneous echotexture throughout the uterus, compatible with multifocal fibroids.  On the right, there are two lesions measuring 5.9 x 6.0 x 4.8 cm and 2.6 x 2.5 x 2.4 cm.  On the left, there are two lesions measuring 2.4 x 2.3 x 2.1 cm and 3.3 x 2.7 x 3.0 cm. In the lower uterine segment there is an additional lesion measuring 1.2 x 1.2 x 1.8 cm.  Endometrium: Endometrial stripe is borderline thickened measuring 15 mm.  Right ovary:  Could not be visualized.  Left ovary: Could not be visualized.  Other findings: No free fluid the cul-de-sac.  IMPRESSION: 1.  Fibroid uterus, as above.   Original Report Authenticated By: Florencia Reasons, M.D.    US Pelvis Complete  07/24/2012  *RADIOLOGY REPORT*  Clinical Data: Post menopausal bleeding and pain.  TRANSABDOMINAL AND TRANSVAGINAL ULTRASOUND OF PELVIS Technique:  Both transabdominal and transvaginal ultrasound examinations of the pelvis were performed. Transabdominal technique was performed for global imaging of the pelvis including uterus, ovaries, adnexal regions, and pelvic cul-de-sac.  It was necessary to proceed with endovaginal exam following the transabdominal exam to visualize the uterus and endometrium.  Comparison:  No priors.  Findings:  Uterus: The uterus is enlarged and heterogeneous in appearance measuring 11.0 x 6.5 x 8.4 cm.  There are multiple focal lesions of heterogeneous echotexture throughout the uterus, compatible with multifocal fibroids.  On the right, there are two lesions measuring 5.9 x 6.0 x 4.8 cm and 2.6 x 2.5 x 2.4 cm.  On the left, there are two lesions measuring 2.4 x 2.3 x 2.1 cm and 3.3 x 2.7 x 3.0 cm. In the lower uterine segment there is an additional  lesion measuring 1.2 x 1.2 x 1.8 cm.  Endometrium: Endometrial stripe is borderline thickened measuring 15 mm.  Right ovary:  Could not be visualized.  Left ovary: Could not be visualized.  Other findings: No free fluid the cul-de-sac.  IMPRESSION: 1.  Fibroid uterus, as above.   Original Report Authenticated By: Florencia Reasons, M.D.    Assessment and Plan   1. Fibroid uterus   2. Postmenopausal bleeding      Medication List     As of 07/24/2012  3:14 PM    START taking these medications         HYDROcodone-acetaminophen 5-325 MG per tablet   Commonly known as: NORCO/VICODIN   Take 1 tablet by mouth every 6 (six) hours as needed for  pain.      megestrol 40 MG tablet   Commonly known as: MEGACE   1 tab PO tid until bleeding stops, then 1 tab daily until seen for follow up      CONTINUE taking these medications         citalopram 40 MG tablet   Commonly known as: CELEXA      cyclobenzaprine 10 MG tablet   Commonly known as: FLEXERIL      diclofenac 75 MG EC tablet   Commonly known as: VOLTAREN      vitamin E 400 UNIT capsule   Generic drug: vitamin E          Where to get your medications    These are the prescriptions that you need to pick up.   You may get these medications from any pharmacy.         HYDROcodone-acetaminophen 5-325 MG per tablet   megestrol 40 MG tablet           Follow up with your own doctor as soon as possible Precautions rev'd  Brittanny Levenhagen 07/24/2012, 3:12 PM

## 2012-07-25 LAB — GC/CHLAMYDIA PROBE AMP, GENITAL
Chlamydia, DNA Probe: NEGATIVE
GC Probe Amp, Genital: NEGATIVE

## 2012-08-10 NOTE — MAU Provider Note (Signed)
Attestation of Attending Supervision of Advanced Practitioner: Evaluation and management procedures were performed by the PA/NP/CNM/OB Fellow under my supervision/collaboration. Chart reviewed and agree with management and plan.  Phil Michels V 08/10/2012 10:53 AM

## 2013-09-25 IMAGING — CR DG ABDOMEN ACUTE W/ 1V CHEST
4 series · 4 of 4 positions shown · non-contrast
Comparison: CT of the abdomen and pelvis performed 08/15/2011

CLINICAL DATA: Upper abdominal pain for 2 days; nausea.

ACUTE ABDOMEN SERIES (ABDOMEN 2 VIEW & CHEST 1 VIEW)

[w chest pa]
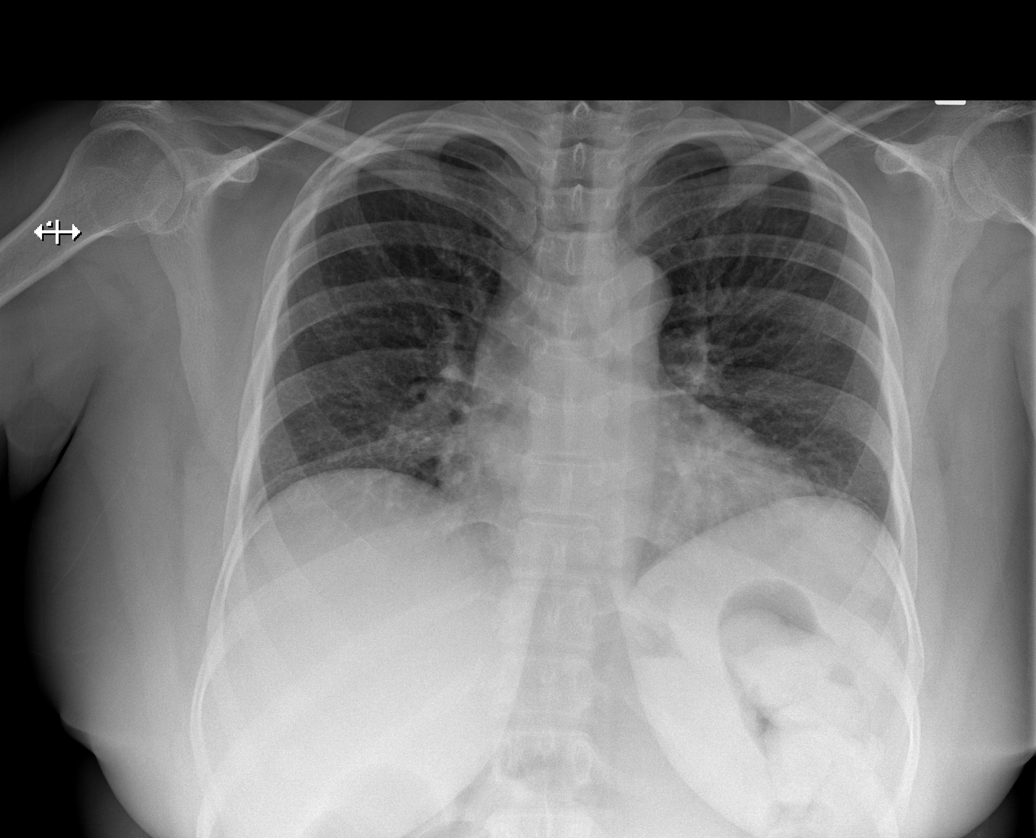

[w abdomen upright]
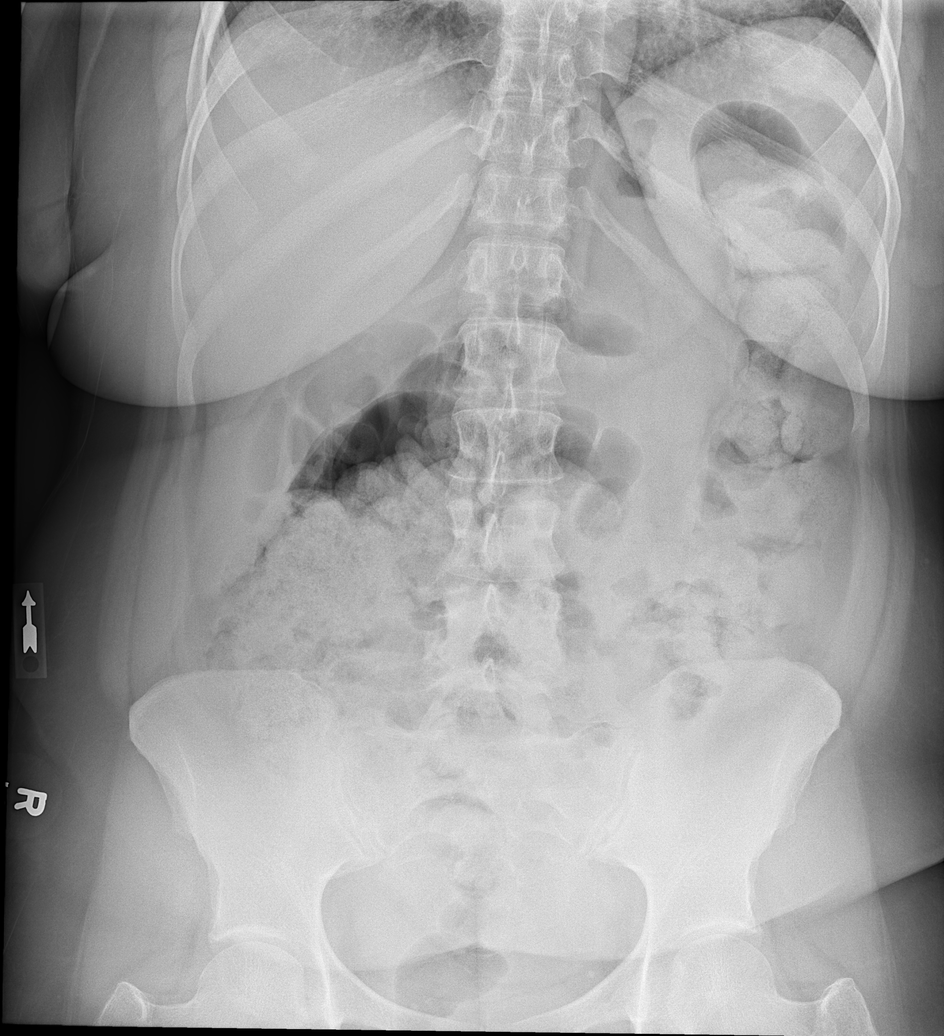

[t abdomen supine (1 of 2)]
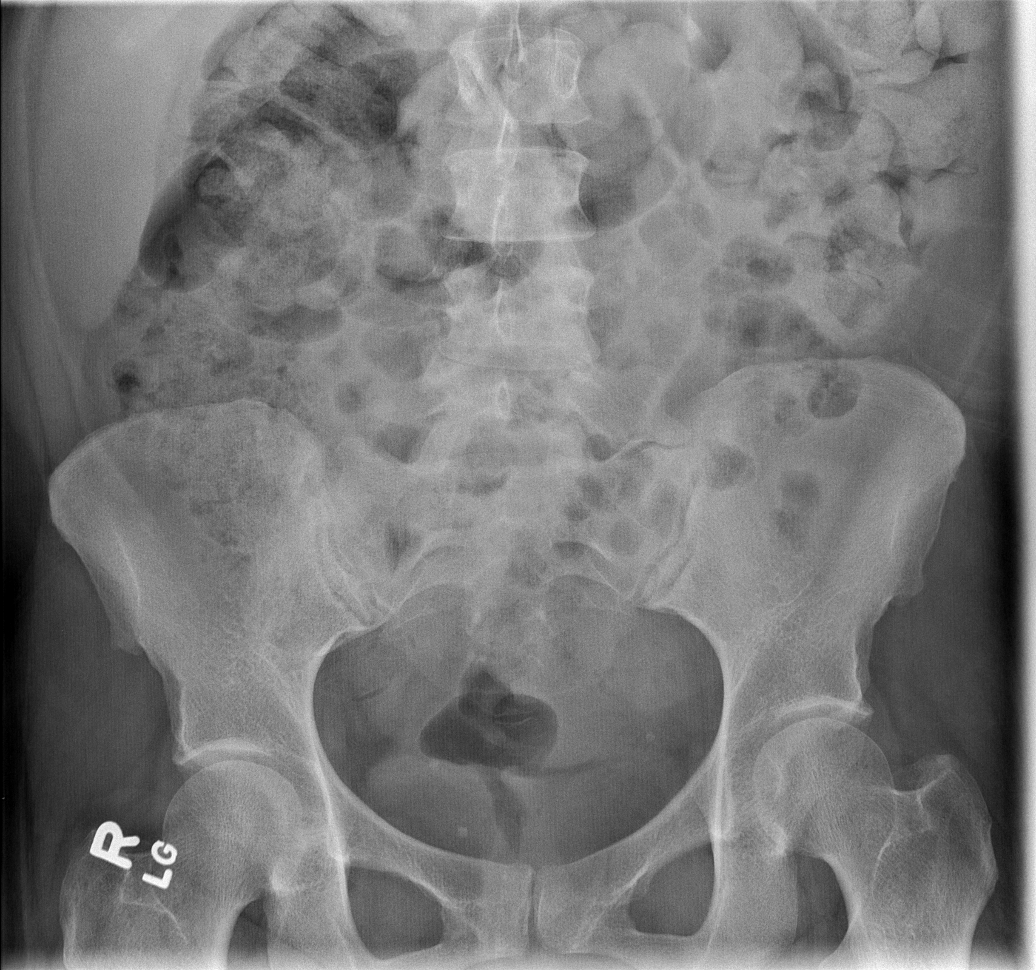

[t abdomen supine (2 of 2)]
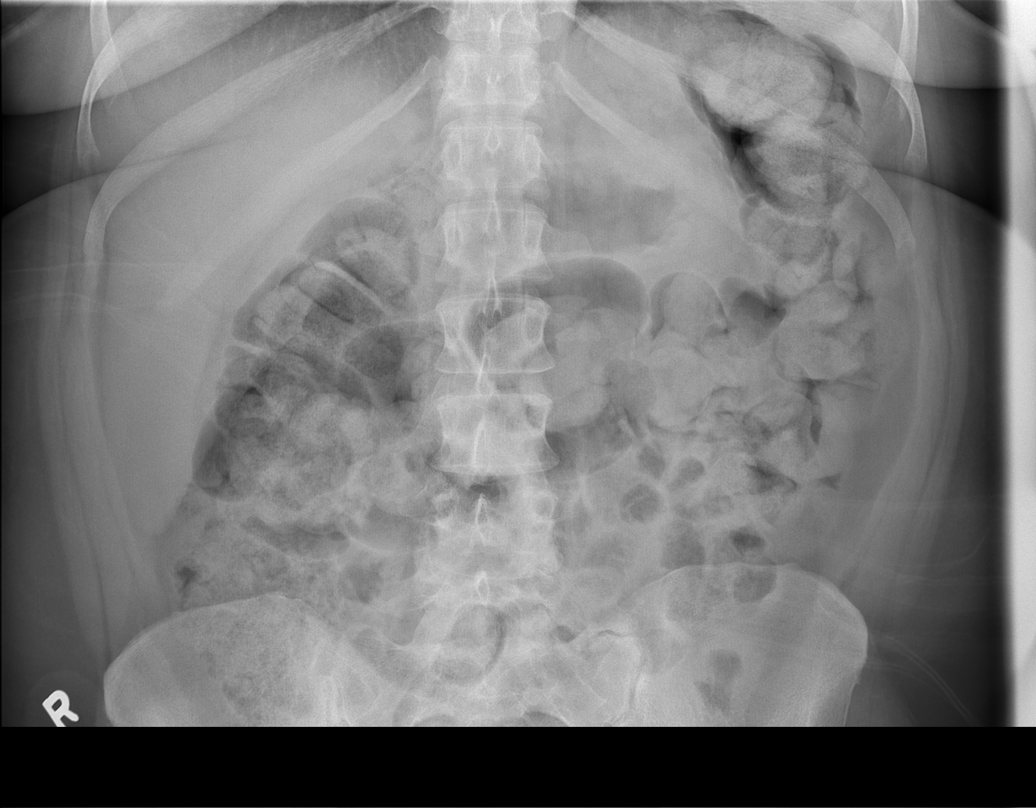

[4 of 4 positions shown; findings below may reference images not displayed]

FINDINGS: The lungs are hypoexpanded.  Mild vascular congestion and
vascular crowding are noted, without significant edema.  No pleural
effusion or pneumothorax is seen.  The cardiomediastinal silhouette
is borderline normal in size.

The visualized bowel gas pattern is unremarkable. Stool and air are
noted throughout the colon; there is no evidence of small bowel
dilatation to suggest obstruction.  A mildly increased stool burden
is noted.  No free intra-abdominal air is identified on the
provided upright view.

No acute osseous abnormalities are seen; the sacroiliac joints are
unremarkable in appearance.  There is partial sacralization of
vertebral body L5 on the left side.
IMPRESSION: 1.  Mild vascular congestion noted; lungs mildly hypoexpanded.  No
significant pulmonary edema seen.
2.  Unremarkable bowel gas pattern; no free intra-abdominal air
seen.  Mildly increased stool burden noted.
3.  Partial sacralization of vertebral body L5 on the left side,
with mild associated degenerative change.  In the appropriate
clinical situation, this could reflect Bertolotti's syndrome, and
can explain chronic lower back pain.

## 2013-10-08 ENCOUNTER — Emergency Department (HOSPITAL_COMMUNITY)
Admission: EM | Admit: 2013-10-08 | Discharge: 2013-10-09 | Disposition: A | Discharge status: Home / Self Care | Payer: Non-veteran care | Attending: Emergency Medicine | Admitting: Emergency Medicine

## 2013-10-08 ENCOUNTER — Emergency Department (HOSPITAL_COMMUNITY): Payer: Non-veteran care

## 2013-10-08 ENCOUNTER — Encounter (HOSPITAL_COMMUNITY): Payer: Self-pay | Admitting: Emergency Medicine

## 2013-10-08 DIAGNOSIS — Z9889 Other specified postprocedural states: Secondary | ICD-10-CM | POA: Insufficient documentation

## 2013-10-08 DIAGNOSIS — K5900 Constipation, unspecified: Secondary | ICD-10-CM | POA: Insufficient documentation

## 2013-10-08 DIAGNOSIS — Z791 Long term (current) use of non-steroidal anti-inflammatories (NSAID): Secondary | ICD-10-CM | POA: Insufficient documentation

## 2013-10-08 DIAGNOSIS — N390 Urinary tract infection, site not specified: Secondary | ICD-10-CM

## 2013-10-08 DIAGNOSIS — R110 Nausea: Secondary | ICD-10-CM | POA: Insufficient documentation

## 2013-10-08 DIAGNOSIS — Z79899 Other long term (current) drug therapy: Secondary | ICD-10-CM | POA: Insufficient documentation

## 2013-10-08 DIAGNOSIS — Z792 Long term (current) use of antibiotics: Secondary | ICD-10-CM | POA: Insufficient documentation

## 2013-10-08 DIAGNOSIS — E119 Type 2 diabetes mellitus without complications: Secondary | ICD-10-CM | POA: Insufficient documentation

## 2013-10-08 HISTORY — DX: Sleep apnea, unspecified: G47.30

## 2013-10-08 HISTORY — DX: Type 2 diabetes mellitus without complications: E11.9

## 2013-10-08 LAB — CBC WITH DIFFERENTIAL/PLATELET
Basophils Absolute: 0 10*3/uL (ref 0.0–0.1)
Basophils Relative: 0 % (ref 0–1)
Eosinophils Absolute: 0.2 10*3/uL (ref 0.0–0.7)
Eosinophils Relative: 2 % (ref 0–5)
HCT: 31.8 % — ABNORMAL LOW (ref 36.0–46.0)
Hemoglobin: 10.8 g/dL — ABNORMAL LOW (ref 12.0–15.0)
Lymphocytes Relative: 21 % (ref 12–46)
Lymphs Abs: 2 10*3/uL (ref 0.7–4.0)
MCH: 24 pg — ABNORMAL LOW (ref 26.0–34.0)
MCHC: 34 g/dL (ref 30.0–36.0)
MCV: 70.7 fL — ABNORMAL LOW (ref 78.0–100.0)
Monocytes Absolute: 0.4 10*3/uL (ref 0.1–1.0)
Monocytes Relative: 4 % (ref 3–12)
Neutro Abs: 6.9 10*3/uL (ref 1.7–7.7)
Neutrophils Relative %: 73 % (ref 43–77)
Platelets: 610 10*3/uL — ABNORMAL HIGH (ref 150–400)
RBC: 4.5 MIL/uL (ref 3.87–5.11)
RDW: 17.7 % — ABNORMAL HIGH (ref 11.5–15.5)
WBC: 9.4 10*3/uL (ref 4.0–10.5)

## 2013-10-08 LAB — BASIC METABOLIC PANEL
BUN: 9 mg/dL (ref 6–23)
CO2: 22 mEq/L (ref 19–32)
Calcium: 9.4 mg/dL (ref 8.4–10.5)
Chloride: 102 mEq/L (ref 96–112)
Creatinine, Ser: 0.79 mg/dL (ref 0.50–1.10)
GFR calc Af Amer: >90 mL/min (ref >90)
GFR calc non Af Amer: >90 mL/min (ref >90)
Glucose, Bld: 208 mg/dL — ABNORMAL HIGH (ref 70–99)
Potassium: 3.6 mEq/L (ref 3.5–5.1)
Sodium: 135 mEq/L (ref 135–145)

## 2013-10-08 MED ORDER — ONDANSETRON 8 MG PO TBDP: 8.0000 mg | ORAL_TABLET | Freq: Three times a day (TID) | ORAL | Status: DC | PRN

## 2013-10-08 MED ORDER — ONDANSETRON 8 MG PO TBDP
8.0000 mg | ORAL_TABLET | Freq: Once | ORAL | Status: AC
  Administered 2013-10-08: 8 mg via ORAL
  Filled 2013-10-08: qty 1

## 2013-10-08 MED ORDER — MAGNESIUM CITRATE PO SOLN: 0.5000 | Freq: Once | ORAL | Status: DC

## 2013-10-08 NOTE — ED Provider Notes (Signed)
CSN: [id number]     Arrival date & time 10/08/13  2108 History   First MD Initiated Contact with Patient 10/08/13 2210     Chief Complaint  Patient presents with  . Nausea    HPI Pt had surgery about a week ago at the [facility] for a lumbar spine fusion (L4-l5).  Pt left the hospital on Friday.  Today she devloped nause and felt flushed.  No vomiting,.  No fevers.  No increasing back pain.  No drainage from the wound.  No burning with urination.   No abdominal pain. Past Medical History  Diagnosis Date  . Diabetes mellitus without complication   . Sleep apnea    Past Surgical History  Procedure Laterality Date  . Wrist and arm surgery    . Hernia repair    . Back surgery     No family history on file. History  Substance Use Topics  . Smoking status: Never Smoker   . Smokeless tobacco: Not on file  . Alcohol Use: No   OB History   Grav Para Term Preterm Abortions TAB SAB Ect Mult Living   2 2 2  0 0 0 0 0 0 2     Review of Systems  All other systems reviewed and are negative.    Allergies  Tetanus toxoids  Home Medications   Current Outpatient Rx  Name  Route  Sig  Dispense  Refill  . cephALEXin (KEFLEX) 500 MG capsule   Oral   Take 1 capsule (500 mg total) by mouth 2 (two) times daily.   14 capsule   0   . citalopram (CELEXA) 40 MG tablet   Oral   Take 20 mg by mouth daily. Take one-half tablet         . cyclobenzaprine (FLEXERIL) 10 MG tablet   Oral   Take 10 mg by mouth 2 (two) times daily.         . diclofenac (VOLTAREN) 75 MG EC tablet   Oral   Take 75 mg by mouth daily.         Marland Kitchen HYDROcodone-acetaminophen (NORCO/VICODIN) 5-325 MG per tablet   Oral   Take 1 tablet by mouth every 6 (six) hours as needed for pain.   30 tablet   0   . magnesium citrate SOLN   Oral   Take 148 mLs (0.5 Bottles total) by mouth once.   195 mL   1   . megestrol (MEGACE) 40 MG tablet      1 tab PO tid until bleeding stops, then 1 tab daily until seen for follow  up   45 tablet   1   . ondansetron (ZOFRAN ODT) 8 MG disintegrating tablet   Oral   Take 1 tablet (8 mg total) by mouth every 8 (eight) hours as needed for nausea or vomiting.   20 tablet   0   . vitamin E (VITAMIN E) 400 UNIT capsule   Oral   Take 400 Units by mouth daily.          BP 131/83  Pulse 91  Temp(Src) 98.1 F (36.7 C) (Oral)  Resp 16  Ht 5\' 4"  (1.626 m)  Wt 250 lb (113.399 kg)  BMI 42.89 kg/m2  SpO2 97% Physical Exam  Nursing note and vitals reviewed. Constitutional: She appears well-developed and well-nourished. No distress.  HENT:  Head: Normocephalic and atraumatic.  Right Ear: External ear normal.  Left Ear: External ear normal.  Eyes: Conjunctivae are normal.  Right eye exhibits no discharge. Left eye exhibits no discharge. No scleral icterus.  Neck: Neck supple. No tracheal deviation present.  Cardiovascular: Normal rate, regular rhythm and intact distal pulses.   Pulmonary/Chest: Effort normal and breath sounds normal. No stridor. No respiratory distress. She has no wheezes. She has no rales.  Abdominal: Soft. Bowel sounds are normal. She exhibits no distension. There is no tenderness. There is no rebound and no guarding.  Musculoskeletal: She exhibits no edema and no tenderness.  Stapled wound midline lumbar spine, no erythema or drainage  Neurological: She is alert. She has normal strength. No sensory deficit. Cranial nerve deficit:  no gross defecits noted. She exhibits normal muscle tone. She displays no seizure activity. Coordination normal.  Skin: Skin is warm and dry. No rash noted.  Psychiatric: She has a normal mood and affect.    ED Course  Procedures (including critical care time) Labs Review Labs Reviewed  CBC WITH DIFFERENTIAL - Abnormal; Notable for the following:    Hemoglobin 10.8 (*)    HCT 31.8 (*)    MCV 70.7 (*)    MCH 24.0 (*)    RDW 17.7 (*)    Platelets 610 (*)    All other components within normal limits  BASIC  METABOLIC PANEL - Abnormal; Notable for the following:    Glucose, Bld 208 (*)    All other components within normal limits  URINALYSIS, ROUTINE W REFLEX MICROSCOPIC - Abnormal; Notable for the following:    APPearance CLOUDY (*)    Hgb urine dipstick TRACE (*)    Leukocytes, UA LARGE (*)    All other components within normal limits  URINE CULTURE  URINE MICROSCOPIC-ADD ON   Imaging Review Dg Abd Acute W/chest  10/08/2013   CLINICAL DATA:  Nausea, vomiting, abdominal pain. Unable to lie flat for KUB. Spinal fusion last week.  EXAM: ACUTE ABDOMEN SERIES (ABDOMEN 2 VIEW & CHEST 1 VIEW)  COMPARISON:  Heart size is normal. There is perihilar peribronchial thickening. There are no focal consolidations or pleural effusions.  No free intraperitoneal air beneath the diaphragm. Bowel gas pattern is nonobstructive. There is moderate stool throughout nondilated loops of colon. The liver shadow is enlarged. No abnormal calcifications. The patient has had previous posterior lumbar fusion and laminectomy. Surgical clips overlie the midline.  FINDINGS: Heart size is normal. There is perihilar peribronchial thickening. There are no focal consolidations or pleural effusions.  No free intraperitoneal air beneath the diaphragm. Bowel gas pattern is nonobstructive. There is moderate stool throughout nondilated loops of colon. The liver shadow is enlarged. No abnormal calcifications. The patient has had previous posterior lumbar fusion. Surgical clips overlie the midline. Prior laminectomy.  IMPRESSION: 1. Nonobstructed bowel gas pattern.  Moderate stool burden. 2. Hepatomegaly. 3. Postoperative changes.   Electronically Signed   By: Rosalie Gums M.D.   On: 10/08/2013 23:00    EKG Interpretation   None       MDM   1. Nausea   2. Constipation   3. UTI (urinary tract infection)    Pt with signs of constipation on xray.  Likely related to recent back surgery, diet change, lack of mobility and opiate pain  medications.  No obstruction.  Will dc home with antinausea medications and laxatives.   Rx kelfex for uti   Celene Kras, MD 10/09/13 [phone]

## 2013-10-08 NOTE — ED Notes (Addendum)
Pt transported from home via EMS, pt s/p back surgery 1 week ago at the [place] in [place]. Emesis tonight. 1st BM since surgery today. Denies abd pain. IV est by EMS 20 L AC, Zofran 4mg  IVP given by EMS.

## 2013-10-09 LAB — URINALYSIS, ROUTINE W REFLEX MICROSCOPIC
Bilirubin Urine: NEGATIVE
Glucose, UA: NEGATIVE mg/dL
Ketones, ur: NEGATIVE mg/dL
Nitrite: NEGATIVE
Protein, ur: NEGATIVE mg/dL
Specific Gravity, Urine: 1.022 (ref 1.005–1.030)
Urobilinogen, UA: 1 mg/dL (ref 0.0–1.0)
pH: 6.5 (ref 5.0–8.0)

## 2013-10-09 LAB — URINE MICROSCOPIC-ADD ON

## 2013-10-09 MED ORDER — CEPHALEXIN 500 MG PO CAPS: 500.0000 mg | ORAL_CAPSULE | Freq: Two times a day (BID) | ORAL | Status: DC

## 2013-10-09 MED ORDER — CEPHALEXIN 500 MG PO CAPS
500.0000 mg | ORAL_CAPSULE | Freq: Once | ORAL | Status: AC
  Administered 2013-10-09: 500 mg via ORAL
  Filled 2013-10-09: qty 1

## 2013-10-09 NOTE — Discharge Instructions (Signed)
Constipation, Adult Constipation is when a person:  Poops (bowel movement) less than 3 times a week.  Has a hard time pooping.  Has poop that is dry, hard, or bigger than normal. HOME CARE   Eat more fiber, such as fruits, vegetables, whole grains like brown rice, and beans.  Eat less fatty foods and sugar. This includes [place] fries, hamburgers, cookies, candy, and soda.  If you are not getting enough fiber from food, take products with added fiber in them (supplements).  Drink enough fluid to keep your pee (urine) clear or pale yellow.  Go to the restroom when you feel like you need to poop. Do not hold it.  Only take medicine as told by your doctor. Do not take medicines that help you poop (laxatives) without talking to your doctor first.  Exercise on a regular basis, or as told by your doctor. GET HELP RIGHT AWAY IF:   You have bright red blood in your poop (stool).  Your constipation lasts more than 4 days or gets worse.  You have belly (abdomen) or butt (rectal) pain.  You have thin poop (as thin as a pencil).  You lose weight, and it cannot be explained. MAKE SURE YOU:   Understand these instructions.  Will watch your condition.  Will get help right away if you are not doing well or get worse. Document Released: 04/06/2008 Document Revised: 01/11/2012 Document Reviewed: 09/22/2011 [facility] Patient Information 2014 [place], [place].  Nausea, Adult Nausea is the feeling that you have an upset stomach or have to vomit. Nausea by itself is not likely a serious concern, but it may be an early sign of more serious medical problems. As nausea gets worse, it can lead to vomiting. If vomiting develops, there is the risk of dehydration.  CAUSES   Viral infections.  Food poisoning.  Medicines.  Pregnancy.  Motion sickness.  Migraine headaches.  Emotional distress.  Severe pain from any source.  Alcohol intoxication. HOME CARE INSTRUCTIONS  Get plenty of  rest.  Ask your caregiver about specific rehydration instructions.  Eat small amounts of food and sip liquids more often.  Take all medicines as told by your caregiver. SEEK MEDICAL CARE IF:  You have not improved after 2 days, or you get worse.  You have a headache. SEEK IMMEDIATE MEDICAL CARE IF:   You have a fever.  You faint.  You keep vomiting or have blood in your vomit.  You are extremely weak or dehydrated.  You have dark or bloody stools.  You have severe chest or abdominal pain. MAKE SURE YOU:  Understand these instructions.  Will watch your condition.  Will get help right away if you are not doing well or get worse. Document Released: 11/26/2004 Document Revised: 07/13/2012 Document Reviewed: 07/01/2011 [facility] Patient Information 2014 [place], [place].  Urinary Tract Infection A urinary tract infection (UTI) can occur any place along the urinary tract. The tract includes the kidneys, ureters, bladder, and urethra. A type of germ called bacteria often causes a UTI. UTIs are often helped with antibiotic medicine.  HOME CARE   If given, take antibiotics as told by your doctor. Finish them even if you start to feel better.  Drink enough fluids to keep your pee (urine) clear or pale yellow.  Avoid tea, drinks with caffeine, and bubbly (carbonated) drinks.  Pee often. Avoid holding your pee in for a long time.  Pee before and after having sex (intercourse).  Wipe from front to back after you  poop (bowel movement) if you are a woman. Use each tissue only once. GET HELP RIGHT AWAY IF:   You have back pain.  You have lower belly (abdominal) pain.  You have chills.  You feel sick to your stomach (nauseous).  You throw up (vomit).  Your burning or discomfort with peeing does not go away.  You have a fever.  Your symptoms are not better in 3 days. MAKE SURE YOU:   Understand these instructions.  Will watch your condition.  Will get help right  away if you are not doing well or get worse. Document Released: 04/06/2008 Document Revised: 07/13/2012 Document Reviewed: 05/19/2012 [facility] Patient Information 2014 [place], [place].

## 2013-10-10 LAB — URINE CULTURE: Colony Count: 40000

## 2014-09-03 ENCOUNTER — Encounter (HOSPITAL_COMMUNITY): Payer: Self-pay | Admitting: Emergency Medicine

## 2015-04-15 DIAGNOSIS — M545 Low back pain, unspecified: Secondary | ICD-10-CM | POA: Insufficient documentation

## 2016-04-22 ENCOUNTER — Observation Stay (HOSPITAL_COMMUNITY): Payer: Non-veteran care

## 2016-04-22 ENCOUNTER — Encounter (HOSPITAL_COMMUNITY): Payer: Self-pay | Admitting: Emergency Medicine

## 2016-04-22 ENCOUNTER — Observation Stay (HOSPITAL_COMMUNITY)
Admission: EM | Admit: 2016-04-22 | Discharge: 2016-04-23 | Disposition: A | Discharge status: Home / Self Care | Payer: Non-veteran care | Attending: Internal Medicine | Admitting: Internal Medicine

## 2016-04-22 ENCOUNTER — Emergency Department (HOSPITAL_COMMUNITY): Payer: Non-veteran care

## 2016-04-22 ENCOUNTER — Other Ambulatory Visit: Payer: Self-pay

## 2016-04-22 DIAGNOSIS — G459 Transient cerebral ischemic attack, unspecified: Secondary | ICD-10-CM | POA: Diagnosis not present

## 2016-04-22 DIAGNOSIS — E785 Hyperlipidemia, unspecified: Secondary | ICD-10-CM | POA: Insufficient documentation

## 2016-04-22 DIAGNOSIS — E119 Type 2 diabetes mellitus without complications: Secondary | ICD-10-CM | POA: Insufficient documentation

## 2016-04-22 DIAGNOSIS — R531 Weakness: Principal | ICD-10-CM | POA: Insufficient documentation

## 2016-04-22 DIAGNOSIS — Z6841 Body Mass Index (BMI) 40.0 and over, adult: Secondary | ICD-10-CM | POA: Diagnosis not present

## 2016-04-22 DIAGNOSIS — Z7982 Long term (current) use of aspirin: Secondary | ICD-10-CM | POA: Insufficient documentation

## 2016-04-22 DIAGNOSIS — I2510 Atherosclerotic heart disease of native coronary artery without angina pectoris: Secondary | ICD-10-CM | POA: Diagnosis not present

## 2016-04-22 DIAGNOSIS — R262 Difficulty in walking, not elsewhere classified: Secondary | ICD-10-CM | POA: Insufficient documentation

## 2016-04-22 DIAGNOSIS — G43809 Other migraine, not intractable, without status migrainosus: Secondary | ICD-10-CM | POA: Diagnosis not present

## 2016-04-22 DIAGNOSIS — G4730 Sleep apnea, unspecified: Secondary | ICD-10-CM | POA: Diagnosis not present

## 2016-04-22 DIAGNOSIS — M545 Low back pain: Secondary | ICD-10-CM | POA: Insufficient documentation

## 2016-04-22 DIAGNOSIS — G8929 Other chronic pain: Secondary | ICD-10-CM | POA: Insufficient documentation

## 2016-04-22 DIAGNOSIS — G4733 Obstructive sleep apnea (adult) (pediatric): Secondary | ICD-10-CM | POA: Diagnosis not present

## 2016-04-22 DIAGNOSIS — I10 Essential (primary) hypertension: Secondary | ICD-10-CM | POA: Diagnosis not present

## 2016-04-22 DIAGNOSIS — I639 Cerebral infarction, unspecified: Secondary | ICD-10-CM

## 2016-04-22 HISTORY — DX: Atherosclerotic heart disease of native coronary artery without angina pectoris: I25.10

## 2016-04-22 LAB — COMPREHENSIVE METABOLIC PANEL
ALT: 25 U/L (ref 14–54)
AST: 26 U/L (ref 15–41)
Albumin: 3.6 g/dL (ref 3.5–5.0)
Alkaline Phosphatase: 135 U/L — ABNORMAL HIGH (ref 38–126)
Anion gap: 7 (ref 5–15)
BUN: 8 mg/dL (ref 6–20)
CO2: 26 mmol/L (ref 22–32)
Calcium: 9.4 mg/dL (ref 8.9–10.3)
Chloride: 104 mmol/L (ref 101–111)
Creatinine, Ser: 0.99 mg/dL (ref 0.44–1.00)
GFR calc Af Amer: >60 mL/min (ref >60)
GFR calc non Af Amer: >60 mL/min (ref >60)
Glucose, Bld: 93 mg/dL (ref 65–99)
Potassium: 3.7 mmol/L (ref 3.5–5.1)
Sodium: 137 mmol/L (ref 135–145)
Total Bilirubin: 0.3 mg/dL (ref 0.3–1.2)
Total Protein: 7.7 g/dL (ref 6.5–8.1)

## 2016-04-22 LAB — DIFFERENTIAL
Basophils Absolute: 0 10*3/uL (ref 0.0–0.1)
Basophils Relative: 0 %
Eosinophils Absolute: 0.2 10*3/uL (ref 0.0–0.7)
Eosinophils Relative: 2 %
Lymphocytes Relative: 39 %
Lymphs Abs: 3.2 10*3/uL (ref 0.7–4.0)
Monocytes Absolute: 0.7 10*3/uL (ref 0.1–1.0)
Monocytes Relative: 8 %
Neutro Abs: 4.3 10*3/uL (ref 1.7–7.7)
Neutrophils Relative %: 51 %

## 2016-04-22 LAB — CBC
HCT: 40.1 % (ref 36.0–46.0)
Hemoglobin: 13.6 g/dL (ref 12.0–15.0)
MCH: 25.9 pg — ABNORMAL LOW (ref 26.0–34.0)
MCHC: 33.9 g/dL (ref 30.0–36.0)
MCV: 76.2 fL — ABNORMAL LOW (ref 78.0–100.0)
Platelets: 413 10*3/uL — ABNORMAL HIGH (ref 150–400)
RBC: 5.26 MIL/uL — ABNORMAL HIGH (ref 3.87–5.11)
RDW: 14.3 % (ref 11.5–15.5)
WBC: 8.3 10*3/uL (ref 4.0–10.5)

## 2016-04-22 LAB — I-STAT CHEM 8, ED
BUN: 8 mg/dL (ref 6–20)
Calcium, Ion: 1.12 mmol/L (ref 1.12–1.23)
Chloride: 105 mmol/L (ref 101–111)
Creatinine, Ser: 1 mg/dL (ref 0.44–1.00)
Glucose, Bld: 90 mg/dL (ref 65–99)
HCT: 43 % (ref 36.0–46.0)
Hemoglobin: 14.6 g/dL (ref 12.0–15.0)
Potassium: 3.7 mmol/L (ref 3.5–5.1)
Sodium: 140 mmol/L (ref 135–145)
TCO2: 25 mmol/L (ref 0–100)

## 2016-04-22 LAB — PROTIME-INR
INR: 1.13 (ref 0.00–1.49)
Prothrombin Time: 14.7 seconds (ref 11.6–15.2)

## 2016-04-22 LAB — I-STAT TROPONIN, ED: Troponin i, poc: 0 ng/mL (ref 0.00–0.08)

## 2016-04-22 LAB — APTT: aPTT: 30 seconds (ref 24–37)

## 2016-04-22 LAB — CBG MONITORING, ED: Glucose-Capillary: 90 mg/dL (ref 65–99)

## 2016-04-22 LAB — ETHANOL: Alcohol, Ethyl (B): <5 mg/dL (ref <5)

## 2016-04-22 MED ORDER — ACETAMINOPHEN 500 MG PO TABS
500.0000 mg | ORAL_TABLET | Freq: Four times a day (QID) | ORAL | Status: DC | PRN
  Administered 2016-04-23: 1000 mg via ORAL
  Filled 2016-04-22: qty 2

## 2016-04-22 MED ORDER — ASPIRIN 81 MG PO CHEW
81.0000 mg | CHEWABLE_TABLET | Freq: Every day | ORAL | Status: DC
  Administered 2016-04-23: 81 mg via ORAL
  Filled 2016-04-22: qty 1

## 2016-04-22 MED ORDER — METOCLOPRAMIDE HCL 5 MG/ML IJ SOLN
10.0000 mg | Freq: Once | INTRAMUSCULAR | Status: AC
  Administered 2016-04-22: 10 mg via INTRAVENOUS
  Filled 2016-04-22: qty 2

## 2016-04-22 MED ORDER — DIPHENHYDRAMINE HCL 50 MG/ML IJ SOLN
25.0000 mg | Freq: Once | INTRAMUSCULAR | Status: AC
  Administered 2016-04-22: 25 mg via INTRAVENOUS
  Filled 2016-04-22: qty 1

## 2016-04-22 NOTE — ED Notes (Signed)
Patient transported to MRI 

## 2016-04-22 NOTE — ED Provider Notes (Signed)
CSN: [id number]     Arrival date & time 04/22/16  2141 History   First MD Initiated Contact with Patient 04/22/16 2154     Chief Complaint  Patient presents with  . Code Stroke     (Consider location/radiation/quality/duration/timing/severity/associated sxs/prior Treatment) HPI 8:45 PM patient started developing numbness in the left side of her face and her left arm. Left arm also had weakness. Patient reports at that time she started also to develop a headache that is frontal in nature. She did not previously of headache. No fall or injury. No recent illness. Patient is a very distant history of headaches with childhood. No formal migraine history. No prior stroke history. Patient does endorse recently taking some sinus medications but denies she's had fever, facial pain or significant drainage. Past Medical History  Diagnosis Date  . Diabetes mellitus without complication ([place])   . Sleep apnea   . CAD (coronary artery disease)    Past Surgical History  Procedure Laterality Date  . Wrist and arm surgery    . Hernia repair    . Back surgery     History reviewed. No pertinent family history. Social History  Substance Use Topics  . Smoking status: Never Smoker   . Smokeless tobacco: Never Used  . Alcohol Use: No   OB History    Gravida Para Term Preterm AB TAB SAB Ectopic Multiple Living   2 2 2  0 0 0 0 0 0 2     Review of Systems 10 Systems reviewed and are negative for acute change except as noted in the HPI.    Allergies  Tetanus toxoids  Home Medications   Prior to Admission medications   Medication Sig Start Date End Date Taking? Authorizing Provider  albuterol (PROVENTIL HFA;VENTOLIN HFA) 108 (90 Base) MCG/ACT inhaler Inhale 1-2 puffs into the lungs every 6 (six) hours as needed for wheezing or shortness of breath.   Yes Historical Provider, MD  citalopram (CELEXA) 40 MG tablet Take 40 mg by mouth daily.   Yes Historical Provider, MD  cyclobenzaprine (FLEXERIL)  10 MG tablet Take 10 mg by mouth 2 (two) times daily.   Yes Historical Provider, MD  diclofenac (VOLTAREN) 75 MG EC tablet Take 75 mg by mouth daily.   Yes Historical Provider, MD  HYDROcodone-acetaminophen (NORCO/VICODIN) 5-325 MG per tablet Take 1 tablet by mouth every 6 (six) hours as needed for pain. 07/24/12  Yes Rosezella Rumpf, CNM  ondansetron (ZOFRAN ODT) 8 MG disintegrating tablet Take 1 tablet (8 mg total) by mouth every 8 (eight) hours as needed for nausea or vomiting. 10/08/13  Yes Dorie Rank, MD  Vitamin D, Ergocalciferol, (DRISDOL) 50000 units CAPS capsule Take 50,000 Units by mouth every 7 (seven) days.   Yes Historical Provider, MD  vitamin E (VITAMIN E) 400 UNIT capsule Take 400 Units by mouth daily.   Yes Historical Provider, MD  aspirin 81 MG chewable tablet Chew 1 tablet (81 mg total) by mouth daily. 04/23/16   Zada Finders, MD  atorvastatin (LIPITOR) 20 MG tablet Take 1 tablet (20 mg total) by mouth daily at 6 PM. 04/23/16   Zada Finders, MD   BP 130/80 mmHg  Pulse 78  Temp(Src) 98.1 F (36.7 C) (Oral)  Resp 16  Ht 5\' 4"  (1.626 m)  Wt 265 lb (120.203 kg)  BMI 45.46 kg/m2  SpO2 95% Physical Exam  Constitutional: She is oriented to person, place, and time.  Patient is alert and nontoxic. She appears to be  in moderate discomfort. No respiratory distress. Morbid obesity.  HENT:  Head: Normocephalic and atraumatic.  Right Ear: External ear normal.  Left Ear: External ear normal.  Nose: Nose normal.  Mouth/Throat: Oropharynx is clear and moist.  Patient is endorsing tenderness to palpation of the forehead with percussion. No erythema or swelling.  Eyes: EOM are normal. Pupils are equal, round, and reactive to light.  Neck: Neck supple.  Cardiovascular: Normal rate, regular rhythm, normal heart sounds and intact distal pulses.   Pulmonary/Chest: Effort normal and breath sounds normal.  Abdominal: Soft. She exhibits no distension. There is no tenderness.  Musculoskeletal:  Normal range of motion. She exhibits no edema or tenderness.  Neurological: She is alert and oriented to person, place, and time. No cranial nerve deficit. She exhibits normal muscle tone. Coordination normal.  Skin: Skin is warm and dry.  Psychiatric: She has a normal mood and affect.    ED Course  Procedures (including critical care time) Labs Review Labs Reviewed  CBC - Abnormal; Notable for the following:    RBC 5.26 (*)    MCV 76.2 (*)    MCH 25.9 (*)    Platelets 413 (*)    All other components within normal limits  COMPREHENSIVE METABOLIC PANEL - Abnormal; Notable for the following:    Alkaline Phosphatase 135 (*)    All other components within normal limits  URINE RAPID DRUG SCREEN, HOSP PERFORMED - Abnormal; Notable for the following:    Opiates POSITIVE (*)    All other components within normal limits  HEMOGLOBIN A1C - Abnormal; Notable for the following:    Hgb A1c MFr Bld 5.8 (*)    All other components within normal limits  LIPID PANEL - Abnormal; Notable for the following:    Cholesterol 239 (*)    LDL Cholesterol 167 (*)    All other components within normal limits  COMPREHENSIVE METABOLIC PANEL - Abnormal; Notable for the following:    Albumin 3.3 (*)    Alkaline Phosphatase 133 (*)    All other components within normal limits  CBC - Abnormal; Notable for the following:    RBC 5.17 (*)    MCV 77.8 (*)    MCH 25.9 (*)    All other components within normal limits  ETHANOL  PROTIME-INR  APTT  DIFFERENTIAL  URINALYSIS, ROUTINE W REFLEX MICROSCOPIC (NOT AT [facility])  I-STAT CHEM 8, ED  I-STAT TROPOININ, ED  CBG MONITORING, ED    Imaging Review No results found. I have personally reviewed and evaluated these images and lab results as part of my medical decision-making.   EKG Interpretation   Date/Time:  Wednesday April 22 2016 22:00:02 EDT Ventricular Rate:  79 PR Interval:    QRS Duration: 87 QT Interval:  413 QTC Calculation: 474 R Axis:   31 Text  Interpretation:  Sinus rhythm Borderline T abnormalities, anterior  leads Confirmed by Johnney Killian, MD, Jeannie Done [id number]) on 04/22/2016 10:10:23 PM  Also confirmed by Johnney Killian, MD, Jeannie Done [phone]), editor [place]  CCT,  [place] ([id number])  on 04/23/2016 7:20:55 AM     Consult: Dr. Nicole Kindred has seen as a code stroke. CT head is negative. He will proceed with MRI and plan admission to hospitalist service. MDM   Final diagnoses:  Transient cerebral ischemia, unspecified transient cerebral ischemia type   Evaluated as a code stroke. Patient is alert without airway compromise. Vital signs stable. Admission to hospitalist service.    Charlesetta Shanks, MD 05/02/16 [phone]

## 2016-04-22 NOTE — ED Notes (Addendum)
[facility] [place] [facility] pt began experiencing a left sided headache with left cheek numbness accompanied by left arm weakness. EMS noted controlled drift to left arm. PIV 20G placed. Airway cleared by MD Campos 2145. CBG 95. BP 140/90s.

## 2016-04-22 NOTE — Consult Note (Signed)
Admission H&P    Chief Complaint: Numbness involving left cheek and upper extremity as well as left upper extremity weakness and headache.  HPI: Holly Cook is an 50 y.o. female with a history of diabetes mellitus, sleep apnea, coronary artery disease, obesity and chronic low back pain, brought to the emergency room and code stroke status following onset of numbness on the left side of the face then left upper extremity followed by weakness of left upper extremity. Patient had a mild headache which became worse after numbness and weakness developed in the left upper extremity. She has a history of headaches during childhood which were severe enough to cause nausea. He has no history of migraine headaches as an adult. CT scan of her head showed no acute intracranial abnormality. Deficits resolved with no recurrence. NIH stroke score at the time of this evaluation was 0.  LSN: 8:45 PM on 04/22/2016 tPA Given: No: Deficits rapidly resolved mRankin:  Past Medical History  Diagnosis Date  . Diabetes mellitus without complication ([place])   . Sleep apnea   . CAD (coronary artery disease)     Past Surgical History  Procedure Laterality Date  . Wrist and arm surgery    . Hernia repair    . Back surgery      No family history on file. Social History:  reports that she has never smoked. She does not have any smokeless tobacco history on file. She reports that she does not drink alcohol or use illicit drugs.  Allergies:  Allergies  Allergen Reactions  . Tetanus Toxoids Other (See Comments)    Flu-like symptoms, injection site reaction; treated with antibiotics.  Not certain if reaction to vaccine or infection of injection site    Medications: Preadmission medications were reviewed by me.  ROS: History obtained from the patient  General ROS: negative for - chills, fatigue, fever, night sweats, weight gain or weight loss Psychological ROS: negative for - behavioral disorder, hallucinations,  memory difficulties, mood swings or suicidal ideation Ophthalmic ROS: negative for - blurry vision, double vision, eye pain or loss of vision ENT ROS: negative for - epistaxis, nasal discharge, oral lesions, sore throat, tinnitus or vertigo Allergy and Immunology ROS: negative for - hives or itchy/watery eyes Hematological and Lymphatic ROS: negative for - bleeding problems, bruising or swollen lymph nodes Endocrine ROS: negative for - galactorrhea, hair pattern changes, polydipsia/polyuria or temperature intolerance Respiratory ROS: negative for - cough, hemoptysis, shortness of breath or wheezing Cardiovascular ROS: negative for - chest pain, dyspnea on exertion, edema or irregular heartbeat Gastrointestinal ROS: negative for - abdominal pain, diarrhea, hematemesis, nausea/vomiting or stool incontinence Genito-Urinary ROS: negative for - dysuria, hematuria, incontinence or urinary frequency/urgency Musculoskeletal ROS: Chronic low back pain Neurological ROS: as noted in HPI Dermatological ROS: negative for rash and skin lesion changes  Physical Examination: Blood pressure 140/97, pulse 82, temperature 98.2 F (36.8 C), resp. rate 12, height 5\' 4"  (1.626 m), weight 120.203 kg (265 lb), SpO2 98 %.  HEENT-  Normocephalic, no lesions, without obvious abnormality.  Normal external eye and conjunctiva.  Normal TM's bilaterally.  Normal auditory canals and external ears. Normal external nose, mucus membranes and septum.  Normal pharynx. Neck supple with no masses, nodes, nodules or enlargement. Cardiovascular - regular rate and rhythm, S1, S2 normal, no murmur, click, rub or gallop Lungs - chest clear, no wheezing, rales, normal symmetric air entry Abdomen - soft, non-tender; bowel sounds normal; no masses,  no organomegaly Extremities - no joint deformities,  effusion, or inflammation  Neurologic Examination: Mental Status: Alert, oriented, complaining of headache.  Speech fluent without  evidence of aphasia. Able to follow commands without difficulty. Cranial Nerves: II-Visual fields were normal. III/IV/VI-Pupils were equal and reacted normally to light. Extraocular movements were full and conjugate.    V/VII-no facial numbness and no facial weakness. VIII-normal. X-normal speech and symmetrical palatal movement. XI: trapezius strength/neck flexion strength normal bilaterally XII-midline tongue extension with normal strength. Motor: 5/5 bilaterally with normal tone and bulk Sensory: Normal throughout. Deep Tendon Reflexes: Race to 1+ and symmetric. Plantars: Flexor bilaterally Cerebellar: Normal finger-to-nose testing. Carotid auscultation: Normal  Results for orders placed or performed during the hospital encounter of 04/22/16 (from the past 48 hour(s))  CBG monitoring, ED     Status: None   Collection Time: 04/22/16  9:45 PM  Result Value Ref Range   Glucose-Capillary 90 65 - 99 mg/dL  CBC     Status: Abnormal   Collection Time: 04/22/16  9:47 PM  Result Value Ref Range   WBC 8.3 4.0 - 10.5 K/uL   RBC 5.26 (H) 3.87 - 5.11 MIL/uL   Hemoglobin 13.6 12.0 - 15.0 g/dL   HCT 40.1 36.0 - 46.0 %   MCV 76.2 (L) 78.0 - 100.0 fL   MCH 25.9 (L) 26.0 - 34.0 pg   MCHC 33.9 30.0 - 36.0 g/dL   RDW 14.3 11.5 - 15.5 %   Platelets 413 (H) 150 - 400 K/uL  Differential     Status: None   Collection Time: 04/22/16  9:47 PM  Result Value Ref Range   Neutrophils Relative % 51 %   Neutro Abs 4.3 1.7 - 7.7 K/uL   Lymphocytes Relative 39 %   Lymphs Abs 3.2 0.7 - 4.0 K/uL   Monocytes Relative 8 %   Monocytes Absolute 0.7 0.1 - 1.0 K/uL   Eosinophils Relative 2 %   Eosinophils Absolute 0.2 0.0 - 0.7 K/uL   Basophils Relative 0 %   Basophils Absolute 0.0 0.0 - 0.1 K/uL  I-stat troponin, ED (not at [facility], [facility])     Status: None   Collection Time: 04/22/16  9:52 PM  Result Value Ref Range   Troponin i, poc 0.00 0.00 - 0.08 ng/mL   Comment 3            Comment: Due to the release  kinetics of cTnI, a negative result within the first hours of the onset of symptoms does not rule out myocardial infarction with certainty. If myocardial infarction is still suspected, repeat the test at appropriate intervals.   I-Stat Chem 8, ED  (not at [facility], [facility])     Status: None   Collection Time: 04/22/16  9:54 PM  Result Value Ref Range   Sodium 140 135 - 145 mmol/L   Potassium 3.7 3.5 - 5.1 mmol/L   Chloride 105 101 - 111 mmol/L   BUN 8 6 - 20 mg/dL   Creatinine, Ser 1.00 0.44 - 1.00 mg/dL   Glucose, Bld 90 65 - 99 mg/dL   Calcium, Ion 1.12 1.12 - 1.23 mmol/L   TCO2 25 0 - 100 mmol/L   Hemoglobin 14.6 12.0 - 15.0 g/dL   HCT 43.0 36.0 - 46.0 %   Ct Head Wo Contrast  04/22/2016  CLINICAL DATA:  CODE STROKE- GR called Left arm weakness onset 2045, diffuse ha Neuro- Dr. Nicole Kindred EXAM: CT HEAD WITHOUT CONTRAST TECHNIQUE: Contiguous axial images were obtained from the base of the skull through the  vertex without intravenous contrast. COMPARISON:  05/16/2007 FINDINGS: Brain: Mild atrophy. No evidence of acute infarction, hemorrhage, extra-axial collection, ventriculomegaly, or mass effect. Vascular: No hyperdense vessel or unexpected calcification. Skull: Negative for fracture or focal lesion. Sinuses/Orbits: No acute findings. Other: None. IMPRESSION: 1. Negative for bleed or other acute intracranial process. Critical Value/emergent results were called by telephone at the time of interpretation on 04/22/2016 at 10:00 pm to Dr. Nicole Kindred, who verbally acknowledged these results. Electronically Signed   By: Lucrezia Europe M.D.   On: 04/22/2016 22:01    Assessment: 50 y.o. female with risk factors for stroke presenting with probable TIA with transient numbness involving left face and upper extremity as well as transient upper extremity weakness. Because of her associated headache, complicated migraine phenomena cannot be ruled out.  Stroke Risk Factors - diabetes mellitus and  hypertension  Plan: 1. HgbA1c, fasting lipid panel 2. MRI, MRA  of the brain without contrast 3. PT consult, OT consult, Speech consult 4. Echocardiogram 5. Carotid dopplers 6. Prophylactic therapy-Antiplatelet med: Aspirin  7. Risk factor modification 8. Telemetry monitoring  C.R. Nicole Kindred, MD [facility] [phone]  04/22/2016, 10:21 PM

## 2016-04-23 ENCOUNTER — Encounter (HOSPITAL_COMMUNITY): Payer: No Typology Code available for payment source

## 2016-04-23 ENCOUNTER — Encounter (HOSPITAL_COMMUNITY): Payer: Self-pay | Admitting: General Practice

## 2016-04-23 ENCOUNTER — Observation Stay (HOSPITAL_BASED_OUTPATIENT_CLINIC_OR_DEPARTMENT_OTHER): Payer: Non-veteran care

## 2016-04-23 ENCOUNTER — Other Ambulatory Visit (HOSPITAL_COMMUNITY): Payer: No Typology Code available for payment source

## 2016-04-23 DIAGNOSIS — G43809 Other migraine, not intractable, without status migrainosus: Secondary | ICD-10-CM | POA: Diagnosis not present

## 2016-04-23 DIAGNOSIS — I6789 Other cerebrovascular disease: Secondary | ICD-10-CM | POA: Diagnosis not present

## 2016-04-23 DIAGNOSIS — E785 Hyperlipidemia, unspecified: Secondary | ICD-10-CM

## 2016-04-23 DIAGNOSIS — I639 Cerebral infarction, unspecified: Secondary | ICD-10-CM | POA: Diagnosis not present

## 2016-04-23 DIAGNOSIS — G43909 Migraine, unspecified, not intractable, without status migrainosus: Secondary | ICD-10-CM

## 2016-04-23 LAB — COMPREHENSIVE METABOLIC PANEL
ALT: 22 U/L (ref 14–54)
AST: 25 U/L (ref 15–41)
Albumin: 3.3 g/dL — ABNORMAL LOW (ref 3.5–5.0)
Alkaline Phosphatase: 133 U/L — ABNORMAL HIGH (ref 38–126)
Anion gap: 6 (ref 5–15)
BUN: 7 mg/dL (ref 6–20)
CO2: 25 mmol/L (ref 22–32)
Calcium: 9.2 mg/dL (ref 8.9–10.3)
Chloride: 106 mmol/L (ref 101–111)
Creatinine, Ser: 0.88 mg/dL (ref 0.44–1.00)
GFR calc Af Amer: >60 mL/min (ref >60)
GFR calc non Af Amer: >60 mL/min (ref >60)
Glucose, Bld: 98 mg/dL (ref 65–99)
Potassium: 3.9 mmol/L (ref 3.5–5.1)
Sodium: 137 mmol/L (ref 135–145)
Total Bilirubin: 0.3 mg/dL (ref 0.3–1.2)
Total Protein: 7 g/dL (ref 6.5–8.1)

## 2016-04-23 LAB — ECHOCARDIOGRAM COMPLETE
E decel time: 229 msec
FS: 29 % (ref 28–44)
Height: 64 in
IVS/LV PW RATIO, ED: 1
LA ID, A-P, ES: 38 mm
LA diam end sys: 38 mm
LA diam index: 1.73 cm/m2
LA vol A4C: 57.7 ml
LA vol index: 23.5 mL/m2
LA vol: 51.8 mL
LV PW d: 11 mm — AB (ref 0.6–1.1)
LV e' LATERAL: 9.46 cm/s
LVOT area: 3.46 cm2
LVOT diameter: 21 mm
MV Dec: 229
MV pk E vel: 1 m/s
TAPSE: 24.8 mm
TDI e' lateral: 9.46
TDI e' medial: 12
Weight: 4240 oz

## 2016-04-23 LAB — CBC
HCT: 40.2 % (ref 36.0–46.0)
Hemoglobin: 13.4 g/dL (ref 12.0–15.0)
MCH: 25.9 pg — ABNORMAL LOW (ref 26.0–34.0)
MCHC: 33.3 g/dL (ref 30.0–36.0)
MCV: 77.8 fL — ABNORMAL LOW (ref 78.0–100.0)
Platelets: 396 10*3/uL (ref 150–400)
RBC: 5.17 MIL/uL — ABNORMAL HIGH (ref 3.87–5.11)
RDW: 14.5 % (ref 11.5–15.5)
WBC: 6.8 10*3/uL (ref 4.0–10.5)

## 2016-04-23 LAB — VAS US CAROTID
LEFT ECA DIAS: -20 cm/s
LEFT VERTEBRAL DIAS: -17 cm/s
Left CCA dist dias: 19 cm/s
Left CCA dist sys: 68 cm/s
Left CCA prox dias: 11 cm/s
Left CCA prox sys: 75 cm/s
Left ICA dist dias: -29 cm/s
Left ICA dist sys: -63 cm/s
Left ICA prox dias: -21 cm/s
Left ICA prox sys: -98 cm/s
RIGHT ECA DIAS: -17 cm/s
RIGHT VERTEBRAL DIAS: -14 cm/s
Right CCA prox dias: 22 cm/s
Right CCA prox sys: 84 cm/s
Right cca dist sys: -102 cm/s

## 2016-04-23 LAB — URINALYSIS, ROUTINE W REFLEX MICROSCOPIC
Bilirubin Urine: NEGATIVE
Glucose, UA: NEGATIVE mg/dL
Hgb urine dipstick: NEGATIVE
Ketones, ur: NEGATIVE mg/dL
Leukocytes, UA: NEGATIVE
Nitrite: NEGATIVE
Protein, ur: NEGATIVE mg/dL
Specific Gravity, Urine: 1.014 (ref 1.005–1.030)
pH: 7 (ref 5.0–8.0)

## 2016-04-23 LAB — RAPID URINE DRUG SCREEN, HOSP PERFORMED
Amphetamines: NOT DETECTED
Barbiturates: NOT DETECTED
Benzodiazepines: NOT DETECTED
Cocaine: NOT DETECTED
Opiates: POSITIVE — AB
Tetrahydrocannabinol: NOT DETECTED

## 2016-04-23 LAB — LIPID PANEL
Cholesterol: 239 mg/dL — ABNORMAL HIGH (ref 0–200)
HDL: 53 mg/dL (ref >40)
LDL Cholesterol: 167 mg/dL — ABNORMAL HIGH (ref 0–99)
Total CHOL/HDL Ratio: 4.5 RATIO
Triglycerides: 97 mg/dL (ref <150)
VLDL: 19 mg/dL (ref 0–40)

## 2016-04-23 MED ORDER — SENNOSIDES-DOCUSATE SODIUM 8.6-50 MG PO TABS
1.0000 | ORAL_TABLET | Freq: Every evening | ORAL | Status: DC | PRN
  Filled 2016-04-23: qty 1

## 2016-04-23 MED ORDER — STROKE: EARLY STAGES OF RECOVERY BOOK
Freq: Once | Status: AC
  Administered 2016-04-23: 15:00:00
  Filled 2016-04-23: qty 1

## 2016-04-23 MED ORDER — ENOXAPARIN SODIUM 60 MG/0.6ML ~~LOC~~ SOLN
60.0000 mg | SUBCUTANEOUS | Status: DC
  Filled 2016-04-23: qty 0.6

## 2016-04-23 MED ORDER — ASPIRIN 81 MG PO CHEW: 81.0000 mg | CHEWABLE_TABLET | Freq: Every day | ORAL | Status: DC

## 2016-04-23 MED ORDER — ENOXAPARIN SODIUM 40 MG/0.4ML ~~LOC~~ SOLN: 40.0000 mg | SUBCUTANEOUS | Status: DC

## 2016-04-23 MED ORDER — ATORVASTATIN CALCIUM 20 MG PO TABS: 20.0000 mg | ORAL_TABLET | Freq: Every day | ORAL | Status: DC

## 2016-04-23 MED ORDER — ATORVASTATIN CALCIUM 10 MG PO TABS
20.0000 mg | ORAL_TABLET | Freq: Every day | ORAL | Status: DC
  Administered 2016-04-23: 20 mg via ORAL
  Filled 2016-04-23: qty 2

## 2016-04-23 NOTE — Evaluation (Signed)
Physical Therapy Evaluation Patient Details Name: Holly Cook MRN: [id number] DOB: February 24, 1966 Today's Date: 04/23/2016   History of Present Illness  50 y.o. female brought to the emergency room and code stroke status following onset of numbness on the left side of the face then left upper extremity followed by weakness of left upper extremity. Patient had a mild headache which became worse after numbness and weakness developed in the left upper extremity. PMH signfiicant for DM, CAD, sleep apnea, obesity, and chroinc low back pain.  Clinical Impression  Patient presents with generalized weakness and mild balance deficits which are most likely baseline based on history impacting mobility. Tolerated gait training with supervision for safety with pt holding onto rail, counter for support- furniture walker at home. Uses scooter for longer distances. Pt reports all symptoms have resolved and feels back to baseline. Encouraged mobility while in hospital. Pt does not require skilled therapy services due to above. All education completed. Discharge from therapy.    Follow Up Recommendations No PT follow up;Supervision - Intermittent    Equipment Recommendations  None recommended by PT    Recommendations for Other Services       Precautions / Restrictions Precautions Precautions: Fall Restrictions Weight Bearing Restrictions: No      Mobility  Bed Mobility Overal bed mobility: Modified Independent             General bed mobility comments: HOB flat, no use of bedrails.   Transfers Overall transfer level: Needs assistance Equipment used: None Transfers: Sit to/from Stand Sit to Stand: Modified independent (Device/Increase time)         General transfer comment: Able to stand from EOB without difficulty. Holding onto rail for support. Transferred to chair post ambulation bout.  Ambulation/Gait Ambulation/Gait assistance: Supervision Ambulation Distance (Feet): 100  Feet Assistive device:  (rail in hallway) Gait Pattern/deviations: Step-through pattern;Decreased stride length;Wide base of support Gait velocity: decreased Gait velocity interpretation: Below normal speed for age/gender General Gait Details: Slow, waddling like gait pattern holding onto furniture, counter, rails for support- reports this as baseline.  Stairs            Wheelchair Mobility    Modified Rankin (Stroke Patients Only) Modified Rankin (Stroke Patients Only) Pre-Morbid Rankin Score: Moderate disability Modified Rankin: Moderate disability     Balance Overall balance assessment: Needs assistance Sitting-balance support: Feet supported;No upper extremity supported Sitting balance-Leahy Scale: Good     Standing balance support: During functional activity Standing balance-Leahy Scale: Fair Standing balance comment: More comfortable and needs UE support for dynamic balance                             Pertinent Vitals/Pain Pain Assessment: No/denies pain    Home Living Family/patient expects to be discharged to:: Private residence Living Arrangements: Spouse/significant other;Children Available Help at Discharge: Family;Available 24 hours/day Type of Home: House Home Access: Level entry     Home Layout: Two level;Able to live on main level with bedroom/bathroom Home Equipment: Kasandra Knudsen - single point;Hand held shower head;Electric scooter      Prior Function Level of Independence: Needs assistance   Gait / Transfers Assistance Needed: uses scooter for community mobility; furniture walker for household ambulation.  ADL's / Homemaking Assistance Needed: Children assist with LB dressing and some IADLs  Comments: Reports no falls in last 6 months     Hand Dominance   Dominant Hand: Right    Extremity/Trunk Assessment  Upper Extremity Assessment: Defer to OT evaluation       LUE Deficits / Details: Strength: overall 3+/5, normal grip  strength; ROM: wfl; Sensation: light touch, temperature intact; Increased edema noted - educated pt to elevate on pillow   Lower Extremity Assessment: Generalized weakness (baseline; hx of back surgery. Reports occasional tingling RLE)      Cervical / Trunk Assessment: Other exceptions  Communication   Communication: No difficulties  Cognition Arousal/Alertness: Awake/alert Behavior During Therapy: Flat affect Overall Cognitive Status: Within Functional Limits for tasks assessed Area of Impairment: Safety/judgement         Safety/Judgement: Decreased awareness of safety;Decreased awareness of deficits     General Comments: Pt unaware of LUE weakness and reports it feels the same as before symptoms occurred. Pt with decreased safety awareness and required mod VCs during session.    General Comments General comments (skin integrity, edema, etc.): Pt's HR in the 110's during activity - pt asymptomatic.    Exercises        Assessment/Plan    PT Assessment Patient needs continued PT services;Patent does not need any further PT services  PT Diagnosis Difficulty walking   PT Problem List    PT Treatment Interventions     PT Goals (Current goals can be found in the Care Plan section) Acute Rehab PT Goals Patient Stated Goal: to be able to eat something PT Goal Formulation: All assessment and education complete, DC therapy    Frequency     Barriers to discharge        Co-evaluation               End of Session Equipment Utilized During Treatment: Gait belt Activity Tolerance: Patient tolerated treatment well Patient left: in chair Nurse Communication: Mobility status    Functional Assessment Tool Used: clinical judgment Functional Limitation: Mobility: Walking and moving around Mobility: Walking and Moving Around Current Status [id number]): At least 1 percent but less than 20 percent impaired, limited or restricted Mobility: Walking and Moving Around Goal Status  [phone]): At least 1 percent but less than 20 percent impaired, limited or restricted Mobility: Walking and Moving Around Discharge Status [phone]): At least 1 percent but less than 20 percent impaired, limited or restricted    Time: 0906-0923 PT Time Calculation (min) (ACUTE ONLY): 17 min   Charges:   PT Evaluation $PT Eval Low Complexity: 1 Procedure     PT G Codes:   PT G-Codes **NOT FOR INPATIENT CLASS** Functional Assessment Tool Used: clinical judgment Functional Limitation: Mobility: Walking and moving around Mobility: Walking and Moving Around Current Status [id number]): At least 1 percent but less than 20 percent impaired, limited or restricted Mobility: Walking and Moving Around Goal Status [phone]): At least 1 percent but less than 20 percent impaired, limited or restricted Mobility: Walking and Moving Around Discharge Status [phone]): At least 1 percent but less than 20 percent impaired, limited or restricted    Nessie Nong A Semira Stoltzfus 04/23/2016, 9:26 AM Wray Kearns, PT, DPT [phone]

## 2016-04-23 NOTE — H&P (Signed)
Date: 04/23/2016               Patient Name:  Holly Cook MRN: [id number]  DOB: Feb 24, 1966 Age / Sex: 50 y.o., female   PCP: Provider Not In System         Medical Service: Internal Medicine Teaching Service         Attending Physician: Dr. Aldine Contes, MD    First Contact: Dr. Posey Pronto  Pager: [phone]  Second Contact: Dr. Posey Pronto Pager: [phone]       After Hours (After 5p/  First Contact Pager: [phone]  weekends / holidays): Second Contact Pager: [phone]   Chief Complaint: numbness and weakness on the left side of the body   History of Present Illness: Patient is a 49 year old female with a past medical history of diabetes, coronary artery disease, chronic lower back pain presenting to the hospital complaining of sudden onset weakness and numbness on the left side of her body including her face, arm, and leg. Patient states her symptoms started around 8 PM when she was at church and she was "feeling funny" at that time. Also reports having sudden onset 9/10 intensity, frontal, "pounding" headache and photophobia that started at the same time. She does report having a history of headaches in the past but states this headache was much worse than what she has ever experienced before. States all her symptoms have improved now. Denies having any dizziness/ lightheadedness, fevers, chills, nausea, vomiting, chest pain, shortness of breath,or loss of consciousness. Denies any alcohol or drug use. Denies any prior history of strokes or seizures. States she has been experiencing intermittent numbness in her right leg since her back surgery in 2014.  CT of head done in the ED did not show any acute intracranial abnormality. MRI of brain did not show any acute intracranial abnormality. Intracranial MRA is normal. She was given metoclopramide 10 mg IV once and diphenhydramine 25 mg IV once in the emergency room for her headache.  Meds: Current Facility-Administered Medications  Medication Dose  Route Frequency Provider Last Rate Last Dose  . acetaminophen (TYLENOL) tablet 500-1,000 mg  500-1,000 mg Oral Q6H PRN Milagros Loll, MD   1,000 mg at 04/23/16 0136  . aspirin chewable tablet 81 mg  81 mg Oral Daily Milagros Loll, MD      . enoxaparin (LOVENOX) injection 60 mg  60 mg Subcutaneous Q24H Milagros Loll, MD      . senna-docusate (Senokot-S) tablet 1 tablet  1 tablet Oral QHS PRN Milagros Loll, MD        Allergies: Allergies as of 04/22/2016 - Review Complete 04/22/2016  Allergen Reaction Noted  . Tetanus toxoids Other (See Comments) 01/11/2012   Past Medical History  Diagnosis Date  . Diabetes mellitus without complication ([place])   . Sleep apnea   . CAD (coronary artery disease)    Past Surgical History  Procedure Laterality Date  . Wrist and arm surgery    . Hernia repair    . Back surgery     No family history on file. Social History   Social History  . Marital Status: Married    Spouse Name: N/A  . Number of Children: N/A  . Years of Education: N/A   Occupational History  . Not on file.   Social History Main Topics  . Smoking status: Never Smoker   . Smokeless tobacco: Not on file  . Alcohol Use: No  . Drug Use: No  . Sexual  Activity: Yes    Birth Control/ Protection: None   Other Topics Concern  . Not on file   Social History Narrative    Review of Systems: Review of Systems  Constitutional: Negative for fever and chills.  HENT: Negative for congestion and sore throat.   Eyes: Positive for photophobia. Negative for blurred vision.  Respiratory: Negative for cough, shortness of breath and wheezing.   Cardiovascular: Negative for chest pain, palpitations and leg swelling.  Gastrointestinal: Negative for nausea, vomiting, abdominal pain and diarrhea.  Genitourinary: Negative for dysuria and flank pain.  Musculoskeletal: Negative for myalgias and joint pain.  Skin: Negative for itching and rash.  Neurological: Positive for sensory  change, focal weakness and headaches. Negative for dizziness, seizures and loss of consciousness.    Physical Exam: Blood pressure 131/87, pulse 83, temperature 98.6 F (37 C), temperature source Oral, resp. rate 16, height 5\' 4"  (1.626 m), weight 120.203 kg (265 lb), SpO2 95 %. Physical Exam  Constitutional: She is oriented to person, place, and time. She appears well-developed and well-nourished.  HENT:  Head: Normocephalic and atraumatic.  Mouth/Throat: Oropharynx is clear and moist.  Eyes: EOM are normal. Pupils are equal, round, and reactive to light.  Neck: Neck supple. No tracheal deviation present.  No carotid bruit appreciated  Cardiovascular: Normal rate, regular rhythm and intact distal pulses.  Exam reveals no gallop and no friction rub.   No murmur heard. Pulmonary/Chest: Effort normal and breath sounds normal. No respiratory distress. She has no wheezes. She has no rales.  Abdominal: Soft. Bowel sounds are normal. She exhibits no distension. There is no tenderness. There is no guarding.  Musculoskeletal: She exhibits no edema.  Neurological: She is alert and oriented to person, place, and time. No cranial nerve deficit. Coordination normal.  Speech is fluent. Strength 5/5 and sensation to light touch grossly intact in bilateral upper and lower extremities.  Skin: Skin is warm and dry. No rash noted. No erythema.    Lab results: Basic Metabolic Panel:  Recent Labs  04/22/16 2147 04/22/16 2154  NA 137 140  K 3.7 3.7  CL 104 105  CO2 26  --   GLUCOSE 93 90  BUN 8 8  CREATININE 0.99 1.00  CALCIUM 9.4  --    Liver Function Tests:  Recent Labs  04/22/16 2147  AST 26  ALT 25  ALKPHOS 135*  BILITOT 0.3  PROT 7.7  ALBUMIN 3.6   CBC:  Recent Labs  04/22/16 2147 04/22/16 2154  WBC 8.3  --   NEUTROABS 4.3  --   HGB 13.6 14.6  HCT 40.1 43.0  MCV 76.2*  --   PLT 413*  --    CBG:  Recent Labs  04/22/16 2145  GLUCAP 90    Coagulation:  Recent  Labs  04/22/16 2147  LABPROT 14.7  INR 1.13    Alcohol Level:  Recent Labs  04/22/16 2147  ETH <5   Imaging results:  Ct Head Wo Contrast  04/22/2016  CLINICAL DATA:  CODE STROKE- GR called Left arm weakness onset 2045, diffuse ha Neuro- Dr. Nicole Kindred EXAM: CT HEAD WITHOUT CONTRAST TECHNIQUE: Contiguous axial images were obtained from the base of the skull through the vertex without intravenous contrast. COMPARISON:  05/16/2007 FINDINGS: Brain: Mild atrophy. No evidence of acute infarction, hemorrhage, extra-axial collection, ventriculomegaly, or mass effect. Vascular: No hyperdense vessel or unexpected calcification. Skull: Negative for fracture or focal lesion. Sinuses/Orbits: No acute findings. Other: None. IMPRESSION: 1. Negative for  bleed or other acute intracranial process. Critical Value/emergent results were called by telephone at the time of interpretation on 04/22/2016 at 10:00 pm to Dr. Nicole Kindred, who verbally acknowledged these results. Electronically Signed   By: Lucrezia Europe M.D.   On: 04/22/2016 22:01   Mr Brain Wo Contrast (neuro Protocol)  04/23/2016  CLINICAL DATA:  Initial evaluation for transient left-sided facial and upper extremity numbness followed by weakness. Now resolved. EXAM: MRI HEAD WITHOUT CONTRAST MRA HEAD WITHOUT CONTRAST TECHNIQUE: Multiplanar, multiecho pulse sequences of the brain and surrounding structures were obtained without intravenous contrast. Angiographic images of the head were obtained using MRA technique without contrast. COMPARISON:  Prior CT from earlier the same day FINDINGS: MRI HEAD FINDINGS Cerebral volume normal.  No significant white matter disease. No abnormal foci of restricted diffusion to suggest acute infarct. Gray-white matter differentiation maintained. Major intracranial vascular flow voids are preserved. No acute or chronic intracranial hemorrhage. No areas of chronic infarction. No mass lesion, midline shift, or mass effect. No  hydrocephalus. No extra-axial fluid collection. Major dural sinuses are grossly patent. Craniocervical junction normal. Visualized upper cervical spine unremarkable. Pituitary gland normal. No acute abnormality about the globes and orbits. Paranasal sinuses are clear. No mastoid effusion. Inner ear structures grossly normal. Bone marrow signal intensity within normal limits. No acute scalp soft tissue abnormality. Small lipoma noted at the left frontal scalp. MRA HEAD FINDINGS ANTERIOR CIRCULATION: Visualized distal cervical segments of the internal carotid arteries are patent with antegrade flow. Petrous, cavernous, and supraclinoid segments widely patent. A1 segments, anterior communicating artery, and anterior cerebral arteries well opacified. M1 segments patent without stenosis or occlusion. MCA bifurcations normal. Distal MCA branches well opacified and symmetric. POSTERIOR CIRCULATION: Vertebral arteries patent to the vertebrobasilar junction. Posterior inferior cerebral arteries patent. Basilar artery widely patent. Superior cerebellar arteries patent bilaterally. Both of the posterior cerebral arteries arise in the basilar artery and are well opacified to their distal aspects. Small right posterior communicating artery noted. No aneurysm or vascular malformation. IMPRESSION: 1. Normal brain MRI for patient age. No acute intracranial infarct or other process identified. 2. Normal intracranial MRA. Electronically Signed   By: Jeannine Boga M.D.   On: 04/23/2016 01:58   Mr Virgel Paling (cerebral Arteries)  04/23/2016  CLINICAL DATA:  Initial evaluation for transient left-sided facial and upper extremity numbness followed by weakness. Now resolved. EXAM: MRI HEAD WITHOUT CONTRAST MRA HEAD WITHOUT CONTRAST TECHNIQUE: Multiplanar, multiecho pulse sequences of the brain and surrounding structures were obtained without intravenous contrast. Angiographic images of the head were obtained using MRA technique  without contrast. COMPARISON:  Prior CT from earlier the same day FINDINGS: MRI HEAD FINDINGS Cerebral volume normal.  No significant white matter disease. No abnormal foci of restricted diffusion to suggest acute infarct. Gray-white matter differentiation maintained. Major intracranial vascular flow voids are preserved. No acute or chronic intracranial hemorrhage. No areas of chronic infarction. No mass lesion, midline shift, or mass effect. No hydrocephalus. No extra-axial fluid collection. Major dural sinuses are grossly patent. Craniocervical junction normal. Visualized upper cervical spine unremarkable. Pituitary gland normal. No acute abnormality about the globes and orbits. Paranasal sinuses are clear. No mastoid effusion. Inner ear structures grossly normal. Bone marrow signal intensity within normal limits. No acute scalp soft tissue abnormality. Small lipoma noted at the left frontal scalp. MRA HEAD FINDINGS ANTERIOR CIRCULATION: Visualized distal cervical segments of the internal carotid arteries are patent with antegrade flow. Petrous, cavernous, and supraclinoid segments widely patent. A1 segments, anterior  communicating artery, and anterior cerebral arteries well opacified. M1 segments patent without stenosis or occlusion. MCA bifurcations normal. Distal MCA branches well opacified and symmetric. POSTERIOR CIRCULATION: Vertebral arteries patent to the vertebrobasilar junction. Posterior inferior cerebral arteries patent. Basilar artery widely patent. Superior cerebellar arteries patent bilaterally. Both of the posterior cerebral arteries arise in the basilar artery and are well opacified to their distal aspects. Small right posterior communicating artery noted. No aneurysm or vascular malformation. IMPRESSION: 1. Normal brain MRI for patient age. No acute intracranial infarct or other process identified. 2. Normal intracranial MRA. Electronically Signed   By: Jeannine Boga M.D.   On: 04/23/2016  01:58    Assessment & Plan by Problem: Active Problems:   TIA (transient ischemic attack)  TIA versus complicated migraine Patient is presenting with acute onset frontal headache, photophobia, left sided weakness, and numbness of her left face, arm, and leg. She has a prior history of headaches. Her risk factors for stroke include diabetes. However, imaging is not indicative of an acute stroke. CT of head did not show any acute intracranial abnormality. MRI of brain did not show any acute intracranial abnormality. Intracranial MRA was normal. In addition, her symptoms have now improved and no focal deficits noted on neuro exam. She was seen and evaluated by neurology.  -Admit to telemetry -Appreciate neurology recommendations -Aspirin 81 mg daily -Tylenol [phone] mg every 6 hours as needed for headaches -Pending A1c -Pending lipid panel -PT consult/OT consult, speech consult -Pending echo -Pending carotid Dopplers -CBC in a.m. -CMP in a.m. -Pending GGT -Frequent neuro checks  DM 2 Hemoglobin A1c checked in March 2012 was 6.0. She is currently not on any diabetes medications. -A1c pending  DVT prophylaxis: Lovenox  Diet: Nothing by mouth  Dispo: Disposition is deferred at this time, awaiting improvement of current medical problems. Anticipated discharge in approximately 1-2 day(s).   The patient does have a current PCP (Provider Not In System) and does need an [facility] hospital follow-up appointment after discharge.  The patient does not have transportation limitations that hinder transportation to clinic appointments.  Signed: Shela Leff, MD 04/23/2016, 2:07 AM

## 2016-04-23 NOTE — Discharge Summary (Signed)
Name: Holly Cook MRN: [id number] DOB: 08/07/1966 50 y.o. PCP: Provider Not In System  Date of Admission: 04/22/2016  9:41 PM Date of Discharge: 04/23/2016 Attending Physician: Aldine Contes, MD  Discharge Diagnosis: 1. Complicated Migraine vs TIA   Discharge Medications:   Medication List    TAKE these medications        albuterol 108 (90 Base) MCG/ACT inhaler  Commonly known as:  PROVENTIL HFA;VENTOLIN HFA  Inhale 1-2 puffs into the lungs every 6 (six) hours as needed for wheezing or shortness of breath.     aspirin 81 MG chewable tablet  Chew 1 tablet (81 mg total) by mouth daily.     atorvastatin 20 MG tablet  Commonly known as:  LIPITOR  Take 1 tablet (20 mg total) by mouth daily at 6 PM.     citalopram 40 MG tablet  Commonly known as:  CELEXA  Take 40 mg by mouth daily.     cyclobenzaprine 10 MG tablet  Commonly known as:  FLEXERIL  Take 10 mg by mouth 2 (two) times daily.     diclofenac 75 MG EC tablet  Commonly known as:  VOLTAREN  Take 75 mg by mouth daily.     HYDROcodone-acetaminophen 5-325 MG tablet  Commonly known as:  NORCO/VICODIN  Take 1 tablet by mouth every 6 (six) hours as needed for pain.     ondansetron 8 MG disintegrating tablet  Commonly known as:  ZOFRAN ODT  Take 1 tablet (8 mg total) by mouth every 8 (eight) hours as needed for nausea or vomiting.     Vitamin D (Ergocalciferol) 50000 units Caps capsule  Commonly known as:  DRISDOL  Take 50,000 Units by mouth every 7 (seven) days.     vitamin E 400 UNIT capsule  Generic drug:  vitamin E  Take 400 Units by mouth daily.        Disposition and follow-up:   Holly Cook was discharged from [facility] in Good condition.  At the hospital follow up visit please address:  1.  Recurrence of symptoms, continued risk factor modification  2.  Labs / imaging needed at time of follow-up: none  3.  Pending labs/ test needing follow-up: none  Follow-up  Appointments:     Follow-up Information    Follow up with [facility].   Contact information:   [place] [id number] [phone]       Discharge Instructions: Discharge Instructions    Call MD for:  difficulty breathing, headache or visual disturbances    Complete by:  As directed      Call MD for:  extreme fatigue    Complete by:  As directed      Call MD for:  persistant dizziness or light-headedness    Complete by:  As directed      Call MD for:  persistant nausea and vomiting    Complete by:  As directed      Call MD for:  severe uncontrolled pain    Complete by:  As directed      Call MD for:  temperature >100.4    Complete by:  As directed      Diet - low sodium heart healthy    Complete by:  As directed            Consultations: Treatment Team:  Md Stroke, MD  Procedures Performed:  Ct Head Wo Contrast  04/22/2016  CLINICAL DATA:  CODE STROKE-  GR called Left arm weakness onset 2045, diffuse ha Neuro- Dr. Nicole Kindred EXAM: CT HEAD WITHOUT CONTRAST TECHNIQUE: Contiguous axial images were obtained from the base of the skull through the vertex without intravenous contrast. COMPARISON:  05/16/2007 FINDINGS: Brain: Mild atrophy. No evidence of acute infarction, hemorrhage, extra-axial collection, ventriculomegaly, or mass effect. Vascular: No hyperdense vessel or unexpected calcification. Skull: Negative for fracture or focal lesion. Sinuses/Orbits: No acute findings. Other: None. IMPRESSION: 1. Negative for bleed or other acute intracranial process. Critical Value/emergent results were called by telephone at the time of interpretation on 04/22/2016 at 10:00 pm to Dr. Nicole Kindred, who verbally acknowledged these results. Electronically Signed   By: Lucrezia Europe M.D.   On: 04/22/2016 22:01   Mr Brain Wo Contrast (neuro Protocol)  04/23/2016  CLINICAL DATA:  Initial evaluation for transient left-sided facial and upper extremity numbness  followed by weakness. Now resolved. EXAM: MRI HEAD WITHOUT CONTRAST MRA HEAD WITHOUT CONTRAST TECHNIQUE: Multiplanar, multiecho pulse sequences of the brain and surrounding structures were obtained without intravenous contrast. Angiographic images of the head were obtained using MRA technique without contrast. COMPARISON:  Prior CT from earlier the same day FINDINGS: MRI HEAD FINDINGS Cerebral volume normal.  No significant white matter disease. No abnormal foci of restricted diffusion to suggest acute infarct. Gray-white matter differentiation maintained. Major intracranial vascular flow voids are preserved. No acute or chronic intracranial hemorrhage. No areas of chronic infarction. No mass lesion, midline shift, or mass effect. No hydrocephalus. No extra-axial fluid collection. Major dural sinuses are grossly patent. Craniocervical junction normal. Visualized upper cervical spine unremarkable. Pituitary gland normal. No acute abnormality about the globes and orbits. Paranasal sinuses are clear. No mastoid effusion. Inner ear structures grossly normal. Bone marrow signal intensity within normal limits. No acute scalp soft tissue abnormality. Small lipoma noted at the left frontal scalp. MRA HEAD FINDINGS ANTERIOR CIRCULATION: Visualized distal cervical segments of the internal carotid arteries are patent with antegrade flow. Petrous, cavernous, and supraclinoid segments widely patent. A1 segments, anterior communicating artery, and anterior cerebral arteries well opacified. M1 segments patent without stenosis or occlusion. MCA bifurcations normal. Distal MCA branches well opacified and symmetric. POSTERIOR CIRCULATION: Vertebral arteries patent to the vertebrobasilar junction. Posterior inferior cerebral arteries patent. Basilar artery widely patent. Superior cerebellar arteries patent bilaterally. Both of the posterior cerebral arteries arise in the basilar artery and are well opacified to their distal aspects.  Small right posterior communicating artery noted. No aneurysm or vascular malformation. IMPRESSION: 1. Normal brain MRI for patient age. No acute intracranial infarct or other process identified. 2. Normal intracranial MRA. Electronically Signed   By: Jeannine Boga M.D.   On: 04/23/2016 01:58   Mr Virgel Paling (cerebral Arteries)  04/23/2016  CLINICAL DATA:  Initial evaluation for transient left-sided facial and upper extremity numbness followed by weakness. Now resolved. EXAM: MRI HEAD WITHOUT CONTRAST MRA HEAD WITHOUT CONTRAST TECHNIQUE: Multiplanar, multiecho pulse sequences of the brain and surrounding structures were obtained without intravenous contrast. Angiographic images of the head were obtained using MRA technique without contrast. COMPARISON:  Prior CT from earlier the same day FINDINGS: MRI HEAD FINDINGS Cerebral volume normal.  No significant white matter disease. No abnormal foci of restricted diffusion to suggest acute infarct. Gray-white matter differentiation maintained. Major intracranial vascular flow voids are preserved. No acute or chronic intracranial hemorrhage. No areas of chronic infarction. No mass lesion, midline shift, or mass effect. No hydrocephalus. No extra-axial fluid collection. Major dural sinuses are grossly patent. Craniocervical junction normal.  Visualized upper cervical spine unremarkable. Pituitary gland normal. No acute abnormality about the globes and orbits. Paranasal sinuses are clear. No mastoid effusion. Inner ear structures grossly normal. Bone marrow signal intensity within normal limits. No acute scalp soft tissue abnormality. Small lipoma noted at the left frontal scalp. MRA HEAD FINDINGS ANTERIOR CIRCULATION: Visualized distal cervical segments of the internal carotid arteries are patent with antegrade flow. Petrous, cavernous, and supraclinoid segments widely patent. A1 segments, anterior communicating artery, and anterior cerebral arteries well opacified.  M1 segments patent without stenosis or occlusion. MCA bifurcations normal. Distal MCA branches well opacified and symmetric. POSTERIOR CIRCULATION: Vertebral arteries patent to the vertebrobasilar junction. Posterior inferior cerebral arteries patent. Basilar artery widely patent. Superior cerebellar arteries patent bilaterally. Both of the posterior cerebral arteries arise in the basilar artery and are well opacified to their distal aspects. Small right posterior communicating artery noted. No aneurysm or vascular malformation. IMPRESSION: 1. Normal brain MRI for patient age. No acute intracranial infarct or other process identified. 2. Normal intracranial MRA. Electronically Signed   By: Jeannine Boga M.D.   On: 04/23/2016 01:58    2D Echo: TTE 04/23/16 - Left ventricle: The cavity size was normal. Systolic function was  normal. The estimated ejection fraction was in the range of 55%  to 60%. Wall motion was normal; there were no regional wall  motion abnormalities. - Left atrium: The atrium was mildly dilated. - Atrial septum: No defect or patent foramen ovale was identified.  Cardiac Cath: n/a  Admission HPI:  Patient is a 50 year old female with a past medical history of diabetes, coronary artery disease, chronic lower back pain presenting to the hospital complaining of sudden onset weakness and numbness on the left side of her body including her face, arm, and leg. Patient states her symptoms started around 8 PM when she was at church and she was "feeling funny" at that time. Also reports having sudden onset 9/10 intensity, frontal, "pounding" headache and photophobia that started at the same time. She does report having a history of headaches in the past but states this headache was much worse than what she has ever experienced before. States all her symptoms have improved now. Denies having any dizziness/ lightheadedness, fevers, chills, nausea, vomiting, chest pain, shortness of  breath,or loss of consciousness. Denies any alcohol or drug use. Denies any prior history of strokes or seizures. States she has been experiencing intermittent numbness in her right leg since her back surgery in 2014.  CT of head done in the ED did not show any acute intracranial abnormality. MRI of brain did not show any acute intracranial abnormality. Intracranial MRA is normal. She was given metoclopramide 10 mg IV once and diphenhydramine 25 mg IV once in the emergency room for her headache.  Hospital Course by problem list:    Complicated migraine vs TIA: Patient's symptoms resolved in the ED after given IV metoclopramide and diphenhydramine. She has a prior history of headaches. CT head, MRI brain, and MRA head were without acute infarct or pathology suggesting stroke. Her symptoms were considered more in line with complicated migraine however TIA is not ruled out. Preliminary carotid dopplers showed 1-39% right sided ICA stenosis, no stenosis on the left. TTE is reassuring. LDL was 167, Hgb A1c 5.8. She was started on Atorvastatin 20 mg daily and ASA 81 mg daily. She will follow up with her PCP at the [place] in [place].   Discharge Vitals:   BP 130/80 mmHg  Pulse 78  Temp(Src) 98.1 F (36.7 C) (Oral)  Resp 16  Ht 5\' 4"  (1.626 m)  Wt 265 lb (120.203 kg)  BMI 45.46 kg/m2  SpO2 95%  Discharge Labs:  Results for orders placed or performed during the hospital encounter of 04/22/16 (from the past 24 hour(s))  CBG monitoring, ED     Status: None   Collection Time: 04/22/16  9:45 PM  Result Value Ref Range   Glucose-Capillary 90 65 - 99 mg/dL  Ethanol     Status: None   Collection Time: 04/22/16  9:47 PM  Result Value Ref Range   Alcohol, Ethyl (B) <5 <5 mg/dL  Protime-INR     Status: None   Collection Time: 04/22/16  9:47 PM  Result Value Ref Range   Prothrombin Time 14.7 11.6 - 15.2 seconds   INR 1.13 0.00 - 1.49  APTT     Status: None   Collection Time: 04/22/16  9:47 PM   Result Value Ref Range   aPTT 30 24 - 37 seconds  CBC     Status: Abnormal   Collection Time: 04/22/16  9:47 PM  Result Value Ref Range   WBC 8.3 4.0 - 10.5 K/uL   RBC 5.26 (H) 3.87 - 5.11 MIL/uL   Hemoglobin 13.6 12.0 - 15.0 g/dL   HCT 40.1 36.0 - 46.0 %   MCV 76.2 (L) 78.0 - 100.0 fL   MCH 25.9 (L) 26.0 - 34.0 pg   MCHC 33.9 30.0 - 36.0 g/dL   RDW 14.3 11.5 - 15.5 %   Platelets 413 (H) 150 - 400 K/uL  Differential     Status: None   Collection Time: 04/22/16  9:47 PM  Result Value Ref Range   Neutrophils Relative % 51 %   Neutro Abs 4.3 1.7 - 7.7 K/uL   Lymphocytes Relative 39 %   Lymphs Abs 3.2 0.7 - 4.0 K/uL   Monocytes Relative 8 %   Monocytes Absolute 0.7 0.1 - 1.0 K/uL   Eosinophils Relative 2 %   Eosinophils Absolute 0.2 0.0 - 0.7 K/uL   Basophils Relative 0 %   Basophils Absolute 0.0 0.0 - 0.1 K/uL  Comprehensive metabolic panel     Status: Abnormal   Collection Time: 04/22/16  9:47 PM  Result Value Ref Range   Sodium 137 135 - 145 mmol/L   Potassium 3.7 3.5 - 5.1 mmol/L   Chloride 104 101 - 111 mmol/L   CO2 26 22 - 32 mmol/L   Glucose, Bld 93 65 - 99 mg/dL   BUN 8 6 - 20 mg/dL   Creatinine, Ser 0.99 0.44 - 1.00 mg/dL   Calcium 9.4 8.9 - 10.3 mg/dL   Total Protein 7.7 6.5 - 8.1 g/dL   Albumin 3.6 3.5 - 5.0 g/dL   AST 26 15 - 41 U/L   ALT 25 14 - 54 U/L   Alkaline Phosphatase 135 (H) 38 - 126 U/L   Total Bilirubin 0.3 0.3 - 1.2 mg/dL   GFR calc non Af Amer >60 >60 mL/min   GFR calc Af Amer >60 >60 mL/min   Anion gap 7 5 - 15  I-stat troponin, ED (not at [facility], [facility])     Status: None   Collection Time: 04/22/16  9:52 PM  Result Value Ref Range   Troponin i, poc 0.00 0.00 - 0.08 ng/mL   Comment 3          I-Stat Chem 8, ED  (not at [facility])  Status: None   Collection Time: 04/22/16  9:54 PM  Result Value Ref Range   Sodium 140 135 - 145 mmol/L   Potassium 3.7 3.5 - 5.1 mmol/L   Chloride 105 101 - 111 mmol/L   BUN 8 6 - 20 mg/dL   Creatinine, Ser  1.00 0.44 - 1.00 mg/dL   Glucose, Bld 90 65 - 99 mg/dL   Calcium, Ion 1.12 1.12 - 1.23 mmol/L   TCO2 25 0 - 100 mmol/L   Hemoglobin 14.6 12.0 - 15.0 g/dL   HCT 43.0 36.0 - 46.0 %  Lipid panel     Status: Abnormal   Collection Time: 04/23/16  2:15 AM  Result Value Ref Range   Cholesterol 239 (H) 0 - 200 mg/dL   Triglycerides 97 <150 mg/dL   HDL 53 >40 mg/dL   Total CHOL/HDL Ratio 4.5 RATIO   VLDL 19 0 - 40 mg/dL   LDL Cholesterol 167 (H) 0 - 99 mg/dL  Comprehensive metabolic panel     Status: Abnormal   Collection Time: 04/23/16  2:15 AM  Result Value Ref Range   Sodium 137 135 - 145 mmol/L   Potassium 3.9 3.5 - 5.1 mmol/L   Chloride 106 101 - 111 mmol/L   CO2 25 22 - 32 mmol/L   Glucose, Bld 98 65 - 99 mg/dL   BUN 7 6 - 20 mg/dL   Creatinine, Ser 0.88 0.44 - 1.00 mg/dL   Calcium 9.2 8.9 - 10.3 mg/dL   Total Protein 7.0 6.5 - 8.1 g/dL   Albumin 3.3 (L) 3.5 - 5.0 g/dL   AST 25 15 - 41 U/L   ALT 22 14 - 54 U/L   Alkaline Phosphatase 133 (H) 38 - 126 U/L   Total Bilirubin 0.3 0.3 - 1.2 mg/dL   GFR calc non Af Amer >60 >60 mL/min   GFR calc Af Amer >60 >60 mL/min   Anion gap 6 5 - 15  CBC     Status: Abnormal   Collection Time: 04/23/16  2:15 AM  Result Value Ref Range   WBC 6.8 4.0 - 10.5 K/uL   RBC 5.17 (H) 3.87 - 5.11 MIL/uL   Hemoglobin 13.4 12.0 - 15.0 g/dL   HCT 40.2 36.0 - 46.0 %   MCV 77.8 (L) 78.0 - 100.0 fL   MCH 25.9 (L) 26.0 - 34.0 pg   MCHC 33.3 30.0 - 36.0 g/dL   RDW 14.5 11.5 - 15.5 %   Platelets 396 150 - 400 K/uL  Urine rapid drug screen (hosp performed)not at [facility]     Status: Abnormal   Collection Time: 04/23/16  9:32 AM  Result Value Ref Range   Opiates POSITIVE (A) NONE DETECTED   Cocaine NONE DETECTED NONE DETECTED   Benzodiazepines NONE DETECTED NONE DETECTED   Amphetamines NONE DETECTED NONE DETECTED   Tetrahydrocannabinol NONE DETECTED NONE DETECTED   Barbiturates NONE DETECTED NONE DETECTED  Urinalysis, Routine w reflex microscopic (not at  [facility])     Status: None   Collection Time: 04/23/16  9:32 AM  Result Value Ref Range   Color, Urine YELLOW YELLOW   APPearance CLEAR CLEAR   Specific Gravity, Urine 1.014 1.005 - 1.030   pH 7.0 5.0 - 8.0   Glucose, UA NEGATIVE NEGATIVE mg/dL   Hgb urine dipstick NEGATIVE NEGATIVE   Bilirubin Urine NEGATIVE NEGATIVE   Ketones, ur NEGATIVE NEGATIVE mg/dL   Protein, ur NEGATIVE NEGATIVE mg/dL   Nitrite NEGATIVE NEGATIVE   Leukocytes,  UA NEGATIVE NEGATIVE    Signed: Zada Finders, MD 04/23/2016, 5:15 PM    Services Ordered on Discharge: none Equipment Ordered on Discharge: no

## 2016-04-23 NOTE — Progress Notes (Signed)
VASCULAR LAB PRELIMINARY  PRELIMINARY  PRELIMINARY  PRELIMINARY  Carotid duplex has been completed.    Right side evidence of  1-39% ICA stenosis.   Left side no evidence of stenosis in the ICA.  Bilaterally Vertebral artery flow is antegrade.     Janifer Adie, RVT, RDMS 04/23/2016, 12:34 PM

## 2016-04-23 NOTE — Progress Notes (Signed)
STROKE TEAM PROGRESS NOTE   HISTORY OF PRESENT ILLNESS (per record) Holly Cook is an 50 y.o. female with a history of diabetes mellitus, sleep apnea, coronary artery disease, obesity and chronic low back pain, brought to the emergency room in code stroke status following onset of numbness on the left side of the face then left upper extremity followed by weakness of left upper extremity. Patient had a mild headache which became worse after numbness and weakness developed in the left upper extremity. She has a history of headaches during childhood which were severe enough to cause nausea. He has no history of migraine headaches as an adult. CT scan of her head showed no acute intracranial abnormality. Deficits resolved with no recurrence. NIH stroke score at the time of this evaluation was 0. She was LKW at 8:45 PM on 04/22/2016. Patient was not administered IV t-PA secondary to deficits rapidly resolved. She was admitted for further evaluation and treatment.   SUBJECTIVE (INTERVAL HISTORY) No family is at the bedside.  She reported she had HAs as a young girl that were controlled with motrin. Since then she from time to time having mild HA if she misses meals. Those HAs can associated with blurry vision but no numbness. This time pt started with left face and then left arm numbness followed by HA, throbbing, worse than her normal HA, with photo and phonophobia but no N/V. HA and numbness gradually resolved after giving HA cocktail in ER. She is back to baseline so far.     OBJECTIVE Temp:  [98.1 F (36.7 C)-98.6 F (37 C)] 98.1 F (36.7 C) (06/22 0900) Pulse Rate:  [65-86] 76 (06/22 0920) Cardiac Rhythm:  [-] Normal sinus rhythm (06/22 0700) Resp:  [12-18] 16 (06/22 0720) BP: (107-154)/(60-97) 150/68 mmHg (06/22 0920) SpO2:  [94 %-100 %] 94 % (06/22 0920) Weight:  [120.203 kg (265 lb)] 120.203 kg (265 lb) (06/21 2206)  CBC:   Recent Labs Lab 04/22/16 2147 04/22/16 2154 04/23/16 0215   WBC 8.3  --  6.8  NEUTROABS 4.3  --   --   HGB 13.6 14.6 13.4  HCT 40.1 43.0 40.2  MCV 76.2*  --  77.8*  PLT 413*  --  [id number]    Basic Metabolic Panel:   Recent Labs Lab 04/22/16 2147 04/22/16 2154 04/23/16 0215  NA 137 140 137  K 3.7 3.7 3.9  CL 104 105 106  CO2 26  --  25  GLUCOSE 93 90 98  BUN 8 8 7   CREATININE 0.99 1.00 0.88  CALCIUM 9.4  --  9.2    Lipid Panel:     Component Value Date/Time   CHOL 239* 04/23/2016 0215   TRIG 97 04/23/2016 0215   HDL 53 04/23/2016 0215   CHOLHDL 4.5 04/23/2016 0215   VLDL 19 04/23/2016 0215   LDLCALC 167* 04/23/2016 0215   HgbA1c:  Lab Results  Component Value Date   HGBA1C * 01/12/2011    6.0 (NOTE)                                                                       According to the ADA Clinical Practice Recommendations for 2011, when HbA1c is used as a screening test:   >=  6.5%   Diagnostic of Diabetes Mellitus           (if abnormal result  is confirmed)  5.7-6.4%   Increased risk of developing Diabetes Mellitus  References:Diagnosis and Classification of Diabetes Mellitus,Diabetes [id number] 1):S62-S69 and Standards of Medical Care in         Diabetes - 2011,Diabetes Care,2011,34  (Suppl 1):S11-S61.   Urine Drug Screen:     Component Value Date/Time   LABOPIA POSITIVE* 04/23/2016 0932   COCAINSCRNUR NONE DETECTED 04/23/2016 0932   LABBENZ NONE DETECTED 04/23/2016 0932   AMPHETMU NONE DETECTED 04/23/2016 0932   THCU NONE DETECTED 04/23/2016 0932   LABBARB NONE DETECTED 04/23/2016 0932      IMAGING  Ct Head Wo Contrast 04/22/2016  1. Negative for bleed or other acute intracranial process.   Mr Brain Wo Contrast (neuro Protocol) 04/23/2016   Normal brain MRI for patient age. No acute intracranial infarct or other process identified.  Mr Virgel Paling (cerebral Arteries) 04/23/2016 . Normal intracranial MRA.   Carotid Doppler   Right side evidence of 1-39% ICA stenosis. Left side no evidence of stenosis in the  ICA. Bilaterally Vertebral artery flow is antegrade.  TTE - - Left ventricle: The cavity size was normal. Systolic function was  normal. The estimated ejection fraction was in the range of 55%  to 60%. Wall motion was normal; there were no regional wall  motion abnormalities. - Left atrium: The atrium was mildly dilated. - Atrial septum: No defect or patent foramen ovale was identified.   PHYSICAL EXAM  Temp:  [98.1 F (36.7 C)-98.6 F (37 C)] 98.1 F (36.7 C) (06/22 1120) Pulse Rate:  [65-86] 78 (06/22 1120) Resp:  [14-17] 16 (06/22 0720) BP: (107-154)/(60-90) 130/80 mmHg (06/22 1320) SpO2:  [94 %-100 %] 95 % (06/22 1320)  General - Well nourished, well developed, in no apparent distress.  Ophthalmologic - Fundi not visualized due to small pupils.  Cardiovascular - Regular rate and rhythm.  Mental Status -  Level of arousal and orientation to time, place, and person were intact. Language including expression, naming, repetition, comprehension was assessed and found intact. Attention span and concentration were normal. Recent and remote memory were intact. Fund of Knowledge was assessed and was intact.  Cranial Nerves II - XII - II - Visual field intact OU. III, IV, VI - Extraocular movements intact. V - Facial sensation intact bilaterally. VII - Facial movement intact bilaterally. VIII - Hearing & vestibular intact bilaterally. X - Palate elevates symmetrically. XI - Chin turning & shoulder shrug intact bilaterally. XII - Tongue protrusion intact.  Motor Strength - The patient's strength was normal in all extremities and pronator drift was absent.  Bulk was normal and fasciculations were absent   Motor Tone - Muscle tone was assessed at the neck and appendages and was normal.  Reflexes - The patient's reflexes were 1+ in all extremities and she had no pathological reflexes.  Sensory - Light touch, temperature/pinprick, vibration and proprioception were assessed  and were symmetrical.    Coordination - The patient had normal movements in the hands and feet with no ataxia or dysmetria.  Tremor was absent.  Gait and Station - The patient's transfers, posture, gait, station, and turns were observed as normal.   ASSESSMENT/PLAN [name] is a 50 y.o. female with history of diabetes mellitus, sleep apnea, coronary artery disease, obesity and chronic low back pain presenting with left cheek and upper extremity numbness and left upper extremity weakness  and headache. She did not receive IV t-PA due to rapidly resolving deficits.   Complicated migraine most likely, less likely TIA  MRI  No acute stroke  MRA  Unremarkable   Carotid Doppler  No significant stenosis   2D Echo  unremarkable   LDL 167  HgbA1c pending  Lovenox 60 mg sq daily for VTE prophylaxis Diet NPO time specified  No antithrombotic prior to admission, now on aspirin 81 mg daily. Continue ASA on discharge.  Patient counseled to be compliant with her antithrombotic medications  Ongoing aggressive stroke risk factor management  Therapy recommendations:  OP OT, no PT  Disposition:  pending   Hypertension  Stable  Permissive hypertension (OK if < 220/120) but gradually normalize in 5-7 days  Long-term BP goal normotensive  Hyperlipidemia  Home meds:  No statin  LDL 167  Added lipitor 20mg    Continue statin at discharge  Diabetes type II  HgbA1c pending, goal < 7.0  Other Stroke Risk Factors  Morbid Obesity, Body mass index is 45.46 kg/(m^2)., recommend weight loss, diet and exercise as appropriate   Coronary artery disease  Obstructive sleep apnea  Hospital day # 1  Neurology will sign off. Please call with questions. Pt will follow up with [facility] in about 1 month. Thanks for the consult.  Rosalin Hawking, MD PhD Stroke Neurology 04/23/2016 10:59 PM    To contact Stroke Continuity provider, please refer to [email or web address] After hours, contact  General Neurology

## 2016-04-23 NOTE — Discharge Instructions (Signed)
Your symptoms are likely due to a complicated migraine. You may have also had what we call a TIA or "mini-stroke" however testing and imaging did not show any sign of stroke.  Please follow up with your regular doctor for risk modification which includes continuing Aspirin and Atorvastatin and to assess for high blood pressure and diabetes.    Transient Ischemic Attack A transient ischemic attack (TIA) is a "warning stroke" that causes stroke-like symptoms. Unlike a stroke, a TIA does not cause permanent damage to the brain. The symptoms of a TIA can happen very fast and do not last long. It is important to know the symptoms of a TIA and what to do. This can help prevent a major stroke or death. CAUSES  A TIA is caused by a temporary blockage in an artery in the brain or neck (carotid artery). The blockage does not allow the brain to get the blood supply it needs and can cause different symptoms. The blockage can be caused by either:  A blood clot.  Fatty buildup (plaque) in a neck or brain artery. RISK FACTORS  High blood pressure (hypertension).  High cholesterol.  Diabetes mellitus.  Heart disease.  The buildup of plaque in the blood vessels (peripheral artery disease or atherosclerosis).  The buildup of plaque in the blood vessels that provide blood and oxygen to the brain (carotid artery stenosis).  An abnormal heart rhythm (atrial fibrillation).  Obesity.  Using any tobacco products, including cigarettes, chewing tobacco, or electronic cigarettes.  Taking oral contraceptives, especially in combination with using tobacco.  Physical inactivity.  A diet high in fats, salt (sodium), and calories.  Excessive alcohol use.  Use of illegal drugs (especially cocaine and methamphetamine).  Being female.  Being African American.  Being over the age of 55 years.  Family history of stroke.  Previous history of blood clots, stroke, TIA, or heart attack.  Sickle cell  disease. SIGNS AND SYMPTOMS  TIA symptoms are the same as a stroke but are temporary. These symptoms usually develop suddenly, or may be newly present upon waking from sleep:  Sudden weakness or numbness of the face, arm, or leg, especially on one side of the body.  Sudden trouble walking or difficulty moving arms or legs.  Sudden confusion.  Sudden personality changes.  Trouble speaking (aphasia) or understanding.  Difficulty swallowing.  Sudden trouble seeing in one or both eyes.  Double vision.  Dizziness.  Loss of balance or coordination.  Sudden severe headache with no known cause.  Trouble reading or writing.  Loss of bowel or bladder control.  Loss of consciousness. DIAGNOSIS  Your health care provider may be able to determine the presence or absence of a TIA based on your symptoms, history, and physical exam. CT scan of the brain is usually performed to help identify a TIA. Other tests may include:  Electrocardiography (ECG).  Continuous heart monitoring.  Echocardiography.  Carotid ultrasonography.  MRI.  A scan of the brain circulation.  Blood tests. TREATMENT  Since the symptoms of TIA are the same as a stroke, it is important to seek treatment as soon as possible. You may need a medicine to dissolve a blood clot (thrombolytic) if that is the cause of the TIA. This medicine cannot be given if too much time has passed. Treatment may also include:   Rest, oxygen, fluids through an IV tube, and medicines to thin the blood (anticoagulants).  Measures will be taken to prevent short-term and long-term complications, including infection  from breathing foreign material into the lungs (aspiration pneumonia), blood clots in the legs, and falls.  Procedures to either remove plaque in the carotid arteries or dilate carotid arteries that have narrowed due to plaque. Those procedures are:  Carotid endarterectomy.  Carotid angioplasty and stenting.  Medicines  and diet may be used to address diabetes, high blood pressure, and other underlying risk factors. HOME CARE INSTRUCTIONS   Take medicines only as directed by your health care provider. Follow the directions carefully. Medicines may be used to control risk factors for a stroke. Be sure you understand all your medicine instructions.  You may be told to take aspirin or the anticoagulant warfarin. Warfarin needs to be taken exactly as instructed.  Taking too much or too little warfarin is dangerous. Too much warfarin increases the risk of bleeding. Too little warfarin continues to allow the risk for blood clots. While taking warfarin, you will need to have regular blood tests to measure your blood clotting time. A PT blood test measures how long it takes for blood to clot. Your PT is used to calculate another value called an INR. Your PT and INR help your health care provider to adjust your dose of warfarin. The dose can change for many reasons. It is critically important that you take warfarin exactly as prescribed.  Many foods, especially foods high in vitamin K can interfere with warfarin and affect the PT and INR. Foods high in vitamin K include spinach, kale, broccoli, cabbage, collard and turnip greens, Brussels sprouts, peas, cauliflower, seaweed, and parsley, as well as beef and pork liver, green tea, and soybean oil. You should eat a consistent amount of foods high in vitamin K. Avoid major changes in your diet, or notify your health care provider before changing your diet. Arrange a visit with a dietitian to answer your questions.  Many medicines can interfere with warfarin and affect the PT and INR. You must tell your health care provider about any and all medicines you take; this includes all vitamins and supplements. Be especially cautious with aspirin and anti-inflammatory medicines. Do not take or discontinue any prescribed or over-the-counter medicine except on the advice of your health care  provider or pharmacist.  Warfarin can have side effects, such as excessive bruising or bleeding. You will need to hold pressure over cuts for longer than usual. Your health care provider or pharmacist will discuss other potential side effects.  Avoid sports or activities that may cause injury or bleeding.  Be careful when shaving, flossing your teeth, or handling sharp objects.  Alcohol can change the body's ability to handle warfarin. It is best to avoid alcoholic drinks or consume only very small amounts while taking warfarin. Notify your health care provider if you change your alcohol intake.  Notify your dentist or other health care providers before procedures.  Eat a diet that includes 5 or more servings of fruits and vegetables each day. This may reduce the risk of stroke. Certain diets may be prescribed to address high blood pressure, high cholesterol, diabetes, or obesity.  A diet low in sodium, saturated fat, trans fat, and cholesterol is recommended to manage high blood pressure.  A diet low in saturated fat, trans fat, and cholesterol, and high in fiber may control cholesterol levels.  A controlled-carbohydrate, controlled-sugar diet is recommended to manage diabetes.  A reduced-calorie diet that is low in sodium, saturated fat, trans fat, and cholesterol is recommended to manage obesity.  Maintain a healthy weight.  Stay  physically active. It is recommended that you get at least 30 minutes of activity on most or all days.  Do not use any tobacco products, including cigarettes, chewing tobacco, or electronic cigarettes. If you need help quitting, ask your health care provider.  Limit alcohol intake to no more than 1 drink per day for nonpregnant women and 2 drinks per day for men. One drink equals 12 ounces of beer, 5 ounces of wine, or 1 ounces of hard liquor.  Do not abuse drugs.  A safe home environment is important to reduce the risk of falls. Your health care provider  may arrange for specialists to evaluate your home. Having grab bars in the bedroom and bathroom is often important. Your health care provider may arrange for equipment to be used at home, such as raised toilets and a seat for the shower.  Follow all instructions for follow-up with your health care provider. This is very important. This includes any referrals and lab tests. Proper follow-up can prevent a stroke or another TIA from occurring. PREVENTION  The risk of a TIA can be decreased by appropriately treating high blood pressure, high cholesterol, diabetes, heart disease, and obesity, and by quitting smoking, limiting alcohol, and staying physically active. SEEK MEDICAL CARE IF:  You have personality changes.  You have difficulty swallowing.  You are seeing double.  You have dizziness.  You have a fever. SEEK IMMEDIATE MEDICAL CARE IF:  Any of the following symptoms may represent a serious problem that is an emergency. Do not wait to see if the symptoms will go away. Get medical help right away. Call your local emergency services (911 in [place] Do not drive yourself to the hospital.  You have sudden weakness or numbness of the face, arm, or leg, especially on one side of the body.  You have sudden trouble walking or difficulty moving arms or legs.  You have sudden confusion.  You have trouble speaking (aphasia) or understanding.  You have sudden trouble seeing in one or both eyes.  You have a loss of balance or coordination.  You have a sudden, severe headache with no known cause.  You have new chest pain or an irregular heartbeat.  You have a partial or total loss of consciousness. MAKE SURE YOU:   Understand these instructions.  Will watch your condition.  Will get help right away if you are not doing well or get worse.   This information is not intended to replace advice given to you by your health care provider. Make sure you discuss any questions you have with your  health care provider.   Document Released: 07/29/2005 Document Revised: 11/09/2014 Document Reviewed: 01/24/2014 Elsevier Interactive Patient Education [employer].

## 2016-04-23 NOTE — Progress Notes (Signed)
   Subjective: Patient reports improvement of her symptoms today and feels she is at her baseline. She says her symptoms of frontal headache, photophobia, and left sided weakness/numbness resolved by the time she was moved to the floor.   Objective: Vital signs in last 24 hours: Filed Vitals:   04/23/16 0900 04/23/16 0920 04/23/16 1120 04/23/16 1320  BP: 154/70 150/68 137/79 130/80  Pulse: 76 76 78   Temp: 98.1 F (36.7 C)  98.1 F (36.7 C)   TempSrc: Oral     Resp:      Height:      Weight:      SpO2: 94% 94%  95%   Weight change:   Intake/Output Summary (Last 24 hours) at 04/23/16 1607 Last data filed at 04/23/16 1330  Gross per 24 hour  Intake    240 ml  Output      0 ml  Net    240 ml   General: resting in a chair, no acute distress HEENT: PERRL, EOMI, no scleral icterus Cardiac: RRR, no rubs, murmurs or gallops Pulm: clear to auscultation bilaterally, moving normal volumes of air Abd: soft, nontender, nondistended Ext: warm and well perfused, no pedal edema Neuro: alert and oriented X3, strength 5/5 all extremities, slowed movement and slight dysmetria of LUE with finger to nose and rapid alternating movement, no dysmetria or dysdiadochokinesis with RUE, heel to shin normal bilaterally   Assessment/Plan: Active Problems:   TIA (transient ischemic attack)  TIA versus complicated migraine: Patient's symptoms resolved this morning. She has a prior history of headaches. Her risk factors for stroke include diabetes. CT head, MRI brain, and MRA head without acute infarct or pathology suggesting stroke. Would consider her symptoms more in line with complicated migraine however TIA is not ruled out. Preliminary carotid dopplers show 1-39% right sided ICA stenosis, no stenosis on the left. TTE is reassuring. PT/OT have recommended outpatient OT. Will continue with risk factor modifications. -Appreciate neurology recommendations -Aspirin 81 mg daily -Started on Atorvastatin 20  mg daily -Pending A1c -Lipid panel: LDL 167 -Follow up with PCP at [place]:  Anticipated discharge today.  The patient does have a current PCP with [facility] (Provider Not In System) and does not need an [facility] hospital follow-up appointment after discharge.  The patient does not have transportation limitations that hinder transportation to clinic appointments.     Zada Finders, MD 04/23/2016, 4:07 PM

## 2016-04-23 NOTE — ED Notes (Signed)
Admitting at bedside 

## 2016-04-23 NOTE — Progress Notes (Signed)
  Echocardiogram 2D Echocardiogram has been performed.  Donata Clay 04/23/2016, 2:37 PM

## 2016-04-23 NOTE — Progress Notes (Signed)
Occupational Therapy Evaluation Patient Details Name: Holly Cook MRN: [id number] DOB: [birth year] Today's Date: 04/23/2016    History of Present Illness 50 y.o. female brought to the emergency room and code stroke status following onset of numbness on the left side of the face then left upper extremity followed by weakness of left upper extremity. Patient had a mild headache which became worse after numbness and weakness developed in the left upper extremity. PMH signfiicant for DM, CAD, sleep apnea, obesity, and chroinc low back pain.   Clinical Impression   PTA, pt was independent with ADLs and uses electric scooter for community mobility. Pt currently presents with LUE weakness and decreased coordination, balance deficits, and decreased safety awareness. Pt plans to d/c home with 24/7 assistance from husband and children. Pt will benefit from continued acute OT to increase independence and safety with ADLs and mobility to allow for safe discharge home. Recommend Outpatient OT upon discharge.    Follow Up Recommendations  Outpatient OT;Supervision - Intermittent    Equipment Recommendations  Other (comment) (Adaptive equipment - pt to purchase if needed)    Recommendations for Other Services       Precautions / Restrictions Precautions Precautions: Fall Restrictions Weight Bearing Restrictions: No      Mobility Bed Mobility Overal bed mobility: Modified Independent             General bed mobility comments: HOB flat, no use of bedrails.   Transfers Overall transfer level: Needs assistance Equipment used: None Transfers: Sit to/from Stand Sit to Stand: Min guard         General transfer comment: Min guard for safety. Pt had 2 LOB while completing ambulating during ADLs in room and had to grab onto bed/grab bar to regain balance.     Balance Overall balance assessment: Needs assistance Sitting-balance support: No upper extremity supported;Feet supported Sitting  balance-Leahy Scale: Good     Standing balance support: No upper extremity supported;During functional activity Standing balance-Leahy Scale: Fair Standing balance comment: able to maintain balance without UE support to complete grooming tasks at sink. 2 LOB when ambulating short distances in room and external support required to regain balance                            ADL Overall ADL's : Needs assistance/impaired Eating/Feeding: Modified independent;Sitting   Grooming: Wash/dry hands;Wash/dry face;Oral care;Supervision/safety;Standing   Upper Body Bathing: Modified independent;Sitting   Lower Body Bathing: Minimal assistance;Sit to/from stand Lower Body Bathing Details (indicate cue type and reason): unable to reach feet Upper Body Dressing : Modified independent;Sitting   Lower Body Dressing: Minimal assistance;Sit to/from stand Lower Body Dressing Details (indicate cue type and reason): unable to reach feet Toilet Transfer: Min guard;Ambulation;Regular Glass blower/designer Details (indicate cue type and reason): LOB when approaching commode, used grab bars to recover balance Toileting- Clothing Manipulation and Hygiene: Min guard;Sit to/from stand       Functional mobility during ADLs: Min guard       Vision Vision Assessment?: Yes Eye Alignment: Within Functional Limits Ocular Range of Motion: Within Functional Limits Alignment/Gaze Preference: Within Defined Limits Tracking/Visual Pursuits: Able to track stimulus in all quads without difficulty Saccades: Decreased speed of saccadic movement Convergence: Impaired (comment) (minimal eye movement observed) Visual Fields: No apparent deficits   Perception     Praxis      Pertinent Vitals/Pain Pain Assessment: No/denies pain     Hand Dominance Right  Extremity/Trunk Assessment Upper Extremity Assessment Upper Extremity Assessment: LUE deficits/detail LUE Deficits / Details: Strength: overall  3+/5, normal grip strength; ROM: wfl; Sensation: light touch, temperature intact; Increased edema noted - educated pt to elevate on pillow LUE Coordination: decreased gross motor   Lower Extremity Assessment Lower Extremity Assessment: Defer to PT evaluation   Cervical / Trunk Assessment Cervical / Trunk Assessment: Other exceptions Cervical / Trunk Exceptions: hx of chronic low back pain   Communication Communication Communication: No difficulties   Cognition Arousal/Alertness: Lethargic Behavior During Therapy: Flat affect Overall Cognitive Status: No family/caregiver present to determine baseline cognitive functioning Area of Impairment: Safety/judgement         Safety/Judgement: Decreased awareness of safety;Decreased awareness of deficits     General Comments: Pt unaware of LUE weakness and reports it feels the same as before symptoms occurred. Pt with decreased safety awareness and required mod VCs during session.   General Comments       Exercises       Shoulder Instructions      Home Living Family/patient expects to be discharged to:: Private residence Living Arrangements: Spouse/significant other;Children Available Help at Discharge: Family;Available 24 hours/day Type of Home: House Home Access: Level entry     Home Layout: Two level;Able to live on main level with bedroom/bathroom Alternate Level Stairs-Number of Steps: flight   Bathroom Shower/Tub: Walk-in Hydrologist: Standard     Home Equipment: Kasandra Knudsen - single point;Hand held Hotel manager          Prior Functioning/Environment Level of Independence: Needs assistance  Gait / Transfers Assistance Needed: uses scooter for community mobility ADL's / Homemaking Assistance Needed: Children assist with LB dressing and some IADLs        OT Diagnosis: Paresis   OT Problem List: Decreased strength;Decreased activity tolerance;Impaired balance (sitting and/or  standing);Decreased safety awareness;Decreased knowledge of use of DME or AE;Obesity;Increased edema   OT Treatment/Interventions: Self-care/ADL training;Therapeutic exercise;Energy conservation;DME and/or AE instruction;Therapeutic activities;Patient/family education;Balance training    OT Goals(Current goals can be found in the care plan section) Acute Rehab OT Goals Patient Stated Goal: none stated OT Goal Formulation: With patient Time For Goal Achievement: 05/07/16 Potential to Achieve Goals: Good ADL Goals Pt Will Perform Lower Body Bathing: with modified independence;with adaptive equipment;sit to/from stand Pt Will Perform Lower Body Dressing: with modified independence;with adaptive equipment;sit to/from stand Pt Will Transfer to Toilet: with modified independence;ambulating;regular height toilet Pt Will Perform Toileting - Clothing Manipulation and hygiene: with modified independence;sit to/from stand;sitting/lateral leans Pt Will Perform Tub/Shower Transfer: Shower transfer;with modified independence;ambulating Pt/caregiver will Perform Home Exercise Program: Increased strength;Left upper extremity;Independently;With written HEP provided  OT Frequency: Min 2X/week   Barriers to D/C:            Co-evaluation              End of Session Equipment Utilized During Treatment: Gait belt Nurse Communication: Mobility status  Activity Tolerance: Patient tolerated treatment well Patient left: in bed;with call bell/phone within reach;with bed alarm set   Time: [phone] OT Time Calculation (min): 24 min Charges:  OT General Charges $OT Visit: 1 Procedure OT Evaluation $OT Eval Moderate Complexity: 1 Procedure OT Treatments $Self Care/Home Management : 8-22 mins G-Codes: OT G-codes **NOT FOR INPATIENT CLASS** Functional Assessment Tool Used: clinical judgement Functional Limitation: Self care Self Care Current Status [id number]): At least 1 percent but less than 20 percent  impaired, limited or restricted Self Care Goal Status [id number]): At  least 1 percent but less than 20 percent impaired, limited or restricted  Redmond Baseman, OTR/L Pager: [phone] 04/23/2016, 9:10 AM

## 2016-04-24 LAB — HEMOGLOBIN A1C
Hgb A1c MFr Bld: 5.8 % — ABNORMAL HIGH (ref 4.8–5.6)
Mean Plasma Glucose: 120 mg/dL

## 2017-04-28 DIAGNOSIS — L6681 Central centrifugal cicatricial alopecia: Secondary | ICD-10-CM | POA: Insufficient documentation

## 2017-11-02 DIAGNOSIS — I639 Cerebral infarction, unspecified: Secondary | ICD-10-CM

## 2017-11-02 HISTORY — DX: Cerebral infarction, unspecified: I63.9

## 2018-09-23 ENCOUNTER — Emergency Department (HOSPITAL_COMMUNITY): Payer: Medicare HMO

## 2018-09-23 ENCOUNTER — Encounter (HOSPITAL_COMMUNITY): Payer: Self-pay | Admitting: Emergency Medicine

## 2018-09-23 ENCOUNTER — Emergency Department (HOSPITAL_COMMUNITY)
Admission: EM | Admit: 2018-09-23 | Discharge: 2018-09-23 | Disposition: A | Discharge status: Home / Self Care | Payer: Medicare HMO | Attending: Emergency Medicine | Admitting: Emergency Medicine

## 2018-09-23 DIAGNOSIS — M545 Low back pain, unspecified: Secondary | ICD-10-CM

## 2018-09-23 DIAGNOSIS — Z79899 Other long term (current) drug therapy: Secondary | ICD-10-CM | POA: Diagnosis not present

## 2018-09-23 DIAGNOSIS — M549 Dorsalgia, unspecified: Secondary | ICD-10-CM | POA: Diagnosis not present

## 2018-09-23 DIAGNOSIS — M542 Cervicalgia: Secondary | ICD-10-CM | POA: Insufficient documentation

## 2018-09-23 DIAGNOSIS — E119 Type 2 diabetes mellitus without complications: Secondary | ICD-10-CM | POA: Insufficient documentation

## 2018-09-23 DIAGNOSIS — Z7982 Long term (current) use of aspirin: Secondary | ICD-10-CM | POA: Diagnosis not present

## 2018-09-23 DIAGNOSIS — S199XXA Unspecified injury of neck, initial encounter: Secondary | ICD-10-CM | POA: Diagnosis not present

## 2018-09-23 DIAGNOSIS — S3992XA Unspecified injury of lower back, initial encounter: Secondary | ICD-10-CM | POA: Diagnosis not present

## 2018-09-23 DIAGNOSIS — S299XXA Unspecified injury of thorax, initial encounter: Secondary | ICD-10-CM | POA: Diagnosis not present

## 2018-09-23 MED ORDER — NAPROXEN 500 MG PO TABS: 500.0000 mg | ORAL_TABLET | Freq: Two times a day (BID) | ORAL | 0 refills | Status: DC | PRN

## 2018-09-23 MED ORDER — METHOCARBAMOL 500 MG PO TABS: 500.0000 mg | ORAL_TABLET | Freq: Two times a day (BID) | ORAL | 0 refills | Status: DC | PRN

## 2018-09-23 MED ORDER — HYDROCODONE-ACETAMINOPHEN 5-325 MG PO TABS
1.0000 | ORAL_TABLET | Freq: Once | ORAL | Status: AC
  Administered 2018-09-23: 1 via ORAL
  Filled 2018-09-23: qty 1
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
## 2019-01-09 DIAGNOSIS — M48062 Spinal stenosis, lumbar region with neurogenic claudication: Secondary | ICD-10-CM | POA: Diagnosis not present

## 2019-01-09 DIAGNOSIS — G8929 Other chronic pain: Secondary | ICD-10-CM | POA: Diagnosis not present

## 2019-01-09 DIAGNOSIS — M47816 Spondylosis without myelopathy or radiculopathy, lumbar region: Secondary | ICD-10-CM | POA: Diagnosis not present

## 2019-01-09 DIAGNOSIS — M5441 Lumbago with sciatica, right side: Secondary | ICD-10-CM | POA: Diagnosis not present

## 2019-01-09 DIAGNOSIS — M5136 Other intervertebral disc degeneration, lumbar region: Secondary | ICD-10-CM | POA: Diagnosis not present

## 2019-01-09 DIAGNOSIS — M5442 Lumbago with sciatica, left side: Secondary | ICD-10-CM | POA: Diagnosis not present

## 2019-02-12 ENCOUNTER — Encounter (HOSPITAL_COMMUNITY): Payer: Self-pay

## 2019-02-12 ENCOUNTER — Other Ambulatory Visit: Payer: Self-pay

## 2019-02-12 ENCOUNTER — Ambulatory Visit (HOSPITAL_COMMUNITY)
Admission: EM | Admit: 2019-02-12 | Discharge: 2019-02-12 | Disposition: A | Discharge status: Home / Self Care | Payer: Medicare HMO | Attending: Family Medicine | Admitting: Family Medicine

## 2019-02-12 DIAGNOSIS — G629 Polyneuropathy, unspecified: Secondary | ICD-10-CM

## 2019-02-12 DIAGNOSIS — M48062 Spinal stenosis, lumbar region with neurogenic claudication: Secondary | ICD-10-CM | POA: Diagnosis not present

## 2019-02-12 MED ORDER — GABAPENTIN 300 MG PO CAPS
300.0000 mg | ORAL_CAPSULE | Freq: Three times a day (TID) | ORAL | 0 refills | Status: DC
Start: 2019-02-12 — End: 2020-03-23

## 2019-02-12 NOTE — Discharge Instructions (Signed)
Take your gabapentin 300 mg at night, every night If it does not cause drowsiness you may take it twice a day morning and night After doing this for several days, if you still do not have drowsiness increase it to 3 times a day Stay on gabapentin 300 mg 3 times a day for a couple of weeks This medicine can be increased until you get pain relief Please call your doctor tomorrow

## 2019-02-12 NOTE — ED Provider Notes (Signed)
[place]    CSN: [id number] Arrival date & time: 02/12/19  1517     History   Chief Complaint Chief Complaint  Patient presents with  . Foot Swelling    HPI Holly Cook is a 53 y.o. female.   HPI  Patient is here for numbness tingling with some feeling of swelling and twitching of her toes that is been progressively worsening over a month.  She states she called her family doctor and was told to come here.  She has known neuropathy in her feet.  She takes gabapentin.  She takes 1 a day at bedtime.  She does not know the dose.  She has spinal stenosis with nerve damage to her legs.  She has neuro claudication.  She spends most of her time in a motorized wheelchair.  She is physically deconditioned.  There is mention made of her starting physical therapy but this has not been possible for her due to the COVID-19  Pandemic.  She states that her feet look more swollen at the end of the day.  They are better in the morning.  She is compliant with her regular care and medications.  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 CAD (coronary artery disease)   . Diabetes mellitus without complication ([place])   . Sleep apnea     Patient Active Problem List   Diagnosis Date Noted  . TIA (transient ischemic attack) 04/22/2016    Past Surgical History:  Procedure Laterality Date  . BACK SURGERY    . HERNIA REPAIR    . wrist and arm surgery      OB History    Gravida  2   Para  2   Term  2   Preterm  0   AB  0   Living  2     SAB  0   TAB  0   Ectopic  0   Multiple  0   Live Births               Home Medications    Prior to Admission medications   Medication Sig Start Date End Date Taking? Authorizing Provider  albuterol (PROVENTIL HFA;VENTOLIN HFA) 108 (90 Base) MCG/ACT inhaler Inhale 1-2 puffs into the lungs every 6 (six) hours as needed for wheezing or shortness of breath.    [provider]  aspirin 81 MG chewable tablet Chew 1 tablet (81 mg total) by mouth daily. 04/23/16   Lenore Cordia, MD  atorvastatin (LIPITOR) 20 MG tablet Take 1 tablet (20 mg total) by mouth daily at 6 PM. 04/23/16   Lenore Cordia, MD  citalopram (CELEXA) 40 MG tablet Take 40 mg by mouth daily.    [provider]  cyclobenzaprine (FLEXERIL) 10 MG tablet Take 10 mg by mouth 2 (two) times daily.    [provider]  gabapentin (NEURONTIN) 300 MG capsule Take 1 capsule (300 mg total) by mouth 3 (three) times daily. 02/12/19   Raylene Everts, MD  ondansetron (ZOFRAN ODT) 8 MG disintegrating tablet Take 1 tablet (8 mg total) by mouth every 8 (eight) hours as needed for nausea or vomiting. 10/08/13   Dorie Rank, MD  Vitamin D, Ergocalciferol, (DRISDOL) 50000 units CAPS capsule Take 50,000 Units by mouth every 7 (seven) days.    [provider]  vitamin E (VITAMIN E) 400 UNIT capsule Take 400 Units by mouth daily.  [provider]    Family History History reviewed. No pertinent family history.  Social History Social History   Tobacco Use  . Smoking status: Never Smoker  . Smokeless tobacco: Never Used  Substance Use Topics  . Alcohol use: No  . Drug use: No     Allergies   Tetanus toxoids   Review of Systems Review of Systems  Constitutional: Negative for chills and fever.  HENT: Negative for ear pain and sore throat.   Eyes: Negative for pain and visual disturbance.  Respiratory: Negative for cough and shortness of breath.   Cardiovascular: Positive for leg swelling. Negative for chest pain and palpitations.  Gastrointestinal: Negative for abdominal pain and vomiting.  Genitourinary: Negative for dysuria and hematuria.  Musculoskeletal: Negative for arthralgias and back pain.  Skin: Negative for color change and rash.  Neurological: Positive for weakness and numbness. Negative for seizures and syncope.       Pain in feet  All other systems reviewed  and are negative.    Physical Exam Triage Vital Signs ED Triage Vitals  Enc Vitals Group     BP 02/12/19 1538 115/84     Pulse Rate 02/12/19 1538 74     Resp 02/12/19 1538 17     Temp 02/12/19 1538 98.6 F (37 C)     Temp Source 02/12/19 1538 Oral     SpO2 02/12/19 1538 100 %     Weight --      Height --      Head Circumference --      Peak Flow --      Pain Score 02/12/19 1535 5     Pain Loc --      Pain Edu? --      Excl. in [place]? --    No data found.  Updated Vital Signs BP 115/84 (BP Location: Right Arm)   Pulse 74   Temp 98.6 F (37 C) (Oral)   Resp 17   SpO2 100%   Visual Acuity Right Eye Distance:   Left Eye Distance:   Bilateral Distance:    Right Eye Near:   Left Eye Near:    Bilateral Near:     Physical Exam Constitutional:      General: She is not in acute distress.    Appearance: She is well-developed. She is obese.     Comments: Depressed affect  HENT:     Head: Normocephalic and atraumatic.  Eyes:     Conjunctiva/sclera: Conjunctivae normal.     Pupils: Pupils are equal, round, and reactive to light.  Neck:     Musculoskeletal: Normal range of motion.  Cardiovascular:     Rate and Rhythm: Normal rate and regular rhythm.     Pulses:          Dorsalis pedis pulses are 1+ on the right side and 1+ on the left side.     Heart sounds: Normal heart sounds.  Pulmonary:     Effort: Pulmonary effort is normal. No respiratory distress.     Breath sounds: Normal breath sounds.  Abdominal:     General: There is no distension.     Palpations: Abdomen is soft.  Musculoskeletal:     Right foot: Decreased range of motion.     Left foot: Decreased range of motion.  Feet:     Right foot:     Skin integrity: Skin integrity normal. No ulcer, blister or skin breakdown.     Toenail Condition: Right toenails  are normal.     Left foot:     Skin integrity: Skin integrity normal. No ulcer, blister or skin breakdown.     Toenail Condition: Left toenails are  normal.     Comments: Patient vocalizes and pulls away with palpation of her feet.  She states they are quite tender to touch.  There is no visible edema.  Pedal pulses are intact.  Cap refill is normal.  Pain with any movement of toes.  Intermittent random twitching of foot and toe muscles is observed.  Sensation appears intact to light touch Skin:    General: Skin is warm and dry.  Neurological:     Mental Status: She is alert.      UC Treatments / Results  Labs (all labs ordered are listed, but only abnormal results are displayed) Labs Reviewed - No data to display  EKG None  Radiology No results found.  Procedures Procedures (including critical care time)  Medications Ordered in UC Medications - No data to display  Initial Impression / Assessment and Plan / UC Course  I have reviewed the triage vital signs and the nursing notes.  Pertinent labs & imaging results that were available during my care of the patient were reviewed by me and considered in my medical decision making (see chart for details).     Patient is taking her gabapentin intermittently.  I believe that her pain in her feet is nerve pain.  She needs to take the gabapentin regularly.  She needs to follow-up with her primary care doctor for additional testing (B12, thyroid). Final Clinical Impressions(s) / UC Diagnoses   Final diagnoses:  Neuropathy  Spinal stenosis of lumbar region with neurogenic claudication     Discharge Instructions     Take your gabapentin 300 mg at night, every night If it does not cause drowsiness you may take it twice a day morning and night After doing this for several days, if you still do not have drowsiness increase it to 3 times a day Stay on gabapentin 300 mg 3 times a day for a couple of weeks This medicine can be increased until you get pain relief Please call your doctor tomorrow    ED Prescriptions    Medication Sig Dispense Auth. Provider   gabapentin  (NEURONTIN) 300 MG capsule Take 1 capsule (300 mg total) by mouth 3 (three) times daily. 90 capsule Raylene Everts, MD     Controlled Substance Prescriptions [place] Controlled Substance Registry consulted? Not Applicable   Raylene Everts, MD 02/12/19 1654

## 2019-02-12 NOTE — ED Triage Notes (Signed)
Patient presents to Urgent Care with complaints of bilateral foot pain, "swelling", and tingling since over a month ago. Patient states she is not diabetic, is able to move toes. Pulses equal and palpable, no swelling or abnormalities noted upon assessment.

## 2019-03-20 DIAGNOSIS — M5441 Lumbago with sciatica, right side: Secondary | ICD-10-CM | POA: Diagnosis not present

## 2019-03-20 DIAGNOSIS — M5136 Other intervertebral disc degeneration, lumbar region: Secondary | ICD-10-CM | POA: Diagnosis not present

## 2019-03-20 DIAGNOSIS — M48062 Spinal stenosis, lumbar region with neurogenic claudication: Secondary | ICD-10-CM | POA: Diagnosis not present

## 2019-03-20 DIAGNOSIS — M47816 Spondylosis without myelopathy or radiculopathy, lumbar region: Secondary | ICD-10-CM | POA: Diagnosis not present

## 2019-03-20 DIAGNOSIS — M5416 Radiculopathy, lumbar region: Secondary | ICD-10-CM | POA: Diagnosis not present

## 2019-03-20 DIAGNOSIS — M5442 Lumbago with sciatica, left side: Secondary | ICD-10-CM | POA: Diagnosis not present

## 2019-03-20 DIAGNOSIS — R200 Anesthesia of skin: Secondary | ICD-10-CM | POA: Diagnosis not present

## 2019-03-20 DIAGNOSIS — R202 Paresthesia of skin: Secondary | ICD-10-CM | POA: Diagnosis not present

## 2019-03-20 DIAGNOSIS — G8929 Other chronic pain: Secondary | ICD-10-CM | POA: Diagnosis not present

## 2019-05-29 DIAGNOSIS — M5416 Radiculopathy, lumbar region: Secondary | ICD-10-CM | POA: Diagnosis not present

## 2019-05-29 DIAGNOSIS — M48062 Spinal stenosis, lumbar region with neurogenic claudication: Secondary | ICD-10-CM | POA: Diagnosis not present

## 2019-05-29 DIAGNOSIS — M5441 Lumbago with sciatica, right side: Secondary | ICD-10-CM | POA: Diagnosis not present

## 2019-05-29 DIAGNOSIS — G8929 Other chronic pain: Secondary | ICD-10-CM | POA: Diagnosis not present

## 2019-05-29 DIAGNOSIS — R200 Anesthesia of skin: Secondary | ICD-10-CM | POA: Diagnosis not present

## 2019-05-29 DIAGNOSIS — R202 Paresthesia of skin: Secondary | ICD-10-CM | POA: Diagnosis not present

## 2019-05-29 DIAGNOSIS — M5442 Lumbago with sciatica, left side: Secondary | ICD-10-CM | POA: Diagnosis not present

## 2019-12-18 ENCOUNTER — Emergency Department (HOSPITAL_COMMUNITY)
Admission: EM | Admit: 2019-12-18 | Discharge: 2019-12-19 | Disposition: A | Discharge status: Home / Self Care | Payer: No Typology Code available for payment source | Attending: Emergency Medicine | Admitting: Emergency Medicine

## 2019-12-18 ENCOUNTER — Encounter (HOSPITAL_COMMUNITY): Payer: Self-pay | Admitting: Emergency Medicine

## 2019-12-18 ENCOUNTER — Ambulatory Visit: Payer: Self-pay

## 2019-12-18 ENCOUNTER — Other Ambulatory Visit: Payer: Self-pay

## 2019-12-18 DIAGNOSIS — R339 Retention of urine, unspecified: Secondary | ICD-10-CM | POA: Diagnosis present

## 2019-12-18 DIAGNOSIS — R102 Pelvic and perineal pain: Secondary | ICD-10-CM | POA: Insufficient documentation

## 2019-12-18 DIAGNOSIS — Z7984 Long term (current) use of oral hypoglycemic drugs: Secondary | ICD-10-CM | POA: Insufficient documentation

## 2019-12-18 DIAGNOSIS — I2510 Atherosclerotic heart disease of native coronary artery without angina pectoris: Secondary | ICD-10-CM | POA: Insufficient documentation

## 2019-12-18 DIAGNOSIS — N179 Acute kidney failure, unspecified: Secondary | ICD-10-CM | POA: Insufficient documentation

## 2019-12-18 DIAGNOSIS — N8110 Cystocele, unspecified: Secondary | ICD-10-CM | POA: Insufficient documentation

## 2019-12-18 DIAGNOSIS — Z79899 Other long term (current) drug therapy: Secondary | ICD-10-CM | POA: Diagnosis not present

## 2019-12-18 DIAGNOSIS — E119 Type 2 diabetes mellitus without complications: Secondary | ICD-10-CM | POA: Diagnosis not present

## 2019-12-18 LAB — COMPREHENSIVE METABOLIC PANEL
ALT: 26 U/L (ref 0–44)
AST: 27 U/L (ref 15–41)
Albumin: 3.5 g/dL (ref 3.5–5.0)
Alkaline Phosphatase: 107 U/L (ref 38–126)
Anion gap: 10 (ref 5–15)
BUN: 12 mg/dL (ref 6–20)
CO2: 23 mmol/L (ref 22–32)
Calcium: 9.2 mg/dL (ref 8.9–10.3)
Chloride: 106 mmol/L (ref 98–111)
Creatinine, Ser: 1.26 mg/dL — ABNORMAL HIGH (ref 0.44–1.00)
GFR calc Af Amer: 56 mL/min — ABNORMAL LOW (ref >60)
GFR calc non Af Amer: 48 mL/min — ABNORMAL LOW (ref >60)
Glucose, Bld: 125 mg/dL — ABNORMAL HIGH (ref 70–99)
Potassium: 3.3 mmol/L — ABNORMAL LOW (ref 3.5–5.1)
Sodium: 139 mmol/L (ref 135–145)
Total Bilirubin: 0.7 mg/dL (ref 0.3–1.2)
Total Protein: 6.7 g/dL (ref 6.5–8.1)

## 2019-12-18 LAB — CBC
HCT: 41.1 % (ref 36.0–46.0)
Hemoglobin: 13.8 g/dL (ref 12.0–15.0)
MCH: 27.4 pg (ref 26.0–34.0)
MCHC: 33.6 g/dL (ref 30.0–36.0)
MCV: 81.5 fL (ref 80.0–100.0)
Platelets: 322 10*3/uL (ref 150–400)
RBC: 5.04 MIL/uL (ref 3.87–5.11)
RDW: 14 % (ref 11.5–15.5)
WBC: 6 10*3/uL (ref 4.0–10.5)
nRBC: 0 % (ref 0.0–0.2)

## 2019-12-18 LAB — I-STAT BETA HCG BLOOD, ED (MC, WL, AP ONLY): I-stat hCG, quantitative: <5 m[IU]/mL (ref <5)

## 2019-12-18 LAB — LIPASE, BLOOD: Lipase: 20 U/L (ref 11–51)

## 2019-12-18 MED ORDER — SODIUM CHLORIDE 0.9% FLUSH: 3.0000 mL | Freq: Once | INTRAVENOUS | Status: DC

## 2019-12-18 NOTE — ED Notes (Signed)
Contact Information- Brexlee Miskiewicz (Daughter) [phone] Oneda Goward (Spouse) [phone]

## 2019-12-18 NOTE — ED Triage Notes (Addendum)
Pt here for possible uterine prolapse. States she felt like something was rolling inside her lower abdomen, and endorses pelvic floor pressure. Pt states she urinated once this morning for the first time in two days.  Pt denies any N/V/D.

## 2019-12-18 NOTE — Telephone Encounter (Signed)
Incoming call  From Patient with a complaint of her uterus coming out.  Wasn't able to urinate all day yesterday.  Didn't void until this morning at 1000am. Voided a lot. Recommended Patient go to Urgent care  For evaluation.   Pt. Voiced understanding.

## 2019-12-19 LAB — URINALYSIS, ROUTINE W REFLEX MICROSCOPIC
Bilirubin Urine: NEGATIVE
Glucose, UA: NEGATIVE mg/dL
Hgb urine dipstick: NEGATIVE
Ketones, ur: NEGATIVE mg/dL
Leukocytes,Ua: NEGATIVE
Nitrite: NEGATIVE
Protein, ur: NEGATIVE mg/dL
Specific Gravity, Urine: 1.018 (ref 1.005–1.030)
pH: 6 (ref 5.0–8.0)

## 2019-12-19 MED ORDER — SODIUM CHLORIDE 0.9 % IV BOLUS
1000.0000 mL | Freq: Once | INTRAVENOUS | Status: AC
Start: 2019-12-19 — End: 2019-12-19
  Administered 2019-12-19: 02:00:00 1000 mL via INTRAVENOUS

## 2019-12-19 NOTE — ED Notes (Signed)
Pt verbalized understanding of d/c instruction and follow up care. Given education on catheter management . Pt had no questions at this time

## 2019-12-19 NOTE — ED Provider Notes (Signed)
[facility] EMERGENCY DEPARTMENT Provider Note   CSN: [id number] Arrival date & time: 12/18/19  1716     History Chief Complaint  Patient presents with  . Abdominal Pain  . Back Pain    Holly Cook is a 54 y.o. female.  HPI     This a 54 year old female with a history of coronary artery disease, diabetes, sleep apnea who presents with urinary retention.  Patient reports for the last 1 week she has noted some fullness in her vaginal area.  She states "I feel like something is down there."  She denies any abdominal pain but does report pressure.  Patient reports over the last 2 days she has had difficulty urinating.  She last urinated yesterday morning.  She denies nausea, vomiting, diarrhea.  Denies constipation.  Denies any fevers.  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  She does not have OB/GYN follow-up.  Past Medical History:  Diagnosis Date  . CAD (coronary artery disease)   . Diabetes mellitus without complication ([place])   . Sleep apnea     Patient Active Problem List   Diagnosis Date Noted  . TIA (transient ischemic attack) 04/22/2016    Past Surgical History:  Procedure Laterality Date  . BACK SURGERY    . HERNIA REPAIR    . wrist and arm surgery       OB History    Gravida  2   Para  2   Term  2   Preterm  0   AB  0   Living  2     SAB  0   TAB  0   Ectopic  0   Multiple  0   Live Births              No family history on file.  Social History   Tobacco Use  . Smoking status: Never Smoker  . Smokeless tobacco: Never Used  Substance Use Topics  . Alcohol use: No  . Drug use: No    Home Medications Prior to Admission medications   Medication Sig Start Date End Date Taking? Authorizing Provider  albuterol (PROVENTIL HFA;VENTOLIN HFA) 108 (90 Base) MCG/ACT inhaler Inhale 1-2 puffs into the lungs every 6 (six) hours as needed for wheezing or shortness of breath.    [provider]  aspirin  81 MG chewable tablet Chew 1 tablet (81 mg total) by mouth daily. 04/23/16   Lenore Cordia, MD  atorvastatin (LIPITOR) 20 MG tablet Take 1 tablet (20 mg total) by mouth daily at 6 PM. 04/23/16   Lenore Cordia, MD  citalopram (CELEXA) 40 MG tablet Take 40 mg by mouth daily.    [provider]  cyclobenzaprine (FLEXERIL) 10 MG tablet Take 10 mg by mouth 2 (two) times daily.    [provider]  gabapentin (NEURONTIN) 300 MG capsule Take 1 capsule (300 mg total) by mouth 3 (three) times daily. 02/12/19   Raylene Everts, MD  ondansetron (ZOFRAN ODT) 8 MG disintegrating tablet Take 1 tablet (8 mg total) by mouth every 8 (eight) hours as needed for nausea or vomiting. 10/08/13   Dorie Rank, MD  Vitamin D, Ergocalciferol, (DRISDOL) 50000 units CAPS capsule Take 50,000 Units by mouth every 7 (seven) days.    [provider]  vitamin E (VITAMIN E) 400 UNIT capsule Take 400 Units by mouth daily.    [provider]    Allergies    Tetanus toxoids  Review of Systems   Review of Systems  Constitutional: Negative for fever.  Respiratory: Negative for shortness of breath.   Cardiovascular: Negative for chest pain.  Gastrointestinal: Negative for abdominal pain, nausea and vomiting.  Genitourinary: Positive for decreased urine volume and pelvic pain. Negative for vaginal discharge and vaginal pain.  All other systems reviewed and are negative.   Physical Exam Updated Vital Signs BP 92/65   Pulse 72   Temp 98.3 F (36.8 C) (Oral)   Resp 17   SpO2 99%   Physical Exam Vitals and nursing note reviewed.  Constitutional:      Appearance: She is well-developed. She is obese.  HENT:     Head: Normocephalic and atraumatic.  Cardiovascular:     Rate and Rhythm: Normal rate and regular rhythm.     Heart sounds: Normal heart sounds.  Pulmonary:     Effort: Pulmonary effort is normal. No respiratory distress.     Breath sounds: No wheezing.  Abdominal:      General: Bowel sounds are normal.     Palpations: Abdomen is soft.     Tenderness: There is no abdominal tenderness.  Genitourinary:    Comments: Patient unable to tolerate speculum exam, digital exam with fullness over the anterior wall of the vagina and evidence of cystocele, no prolapse noted Musculoskeletal:     Cervical back: Neck supple.  Skin:    General: Skin is warm and dry.  Neurological:     Mental Status: She is alert and oriented to person, place, and time.  Psychiatric:        Mood and Affect: Mood normal.     ED Results / Procedures / Treatments   Labs (all labs ordered are listed, but only abnormal results are displayed) Labs Reviewed  COMPREHENSIVE METABOLIC PANEL - Abnormal; Notable for the following components:      Result Value   Potassium 3.3 (*)    Glucose, Bld 125 (*)    Creatinine, Ser 1.26 (*)    GFR calc non Af Amer 48 (*)    GFR calc Af Amer 56 (*)    All other components within normal limits  LIPASE, BLOOD  CBC  URINALYSIS, ROUTINE W REFLEX MICROSCOPIC  I-STAT BETA HCG BLOOD, ED ([facility], [facility], [facility] ONLY)    EKG EKG Interpretation  Date/Time:  Monday December 18 2019 17:31:26 EST Ventricular Rate:  91 PR Interval:  140 QRS Duration: 90 QT Interval:  384 QTC Calculation: 472 R Axis:   -4 Text Interpretation: Sinus rhythm with Fusion complexes T wave abnormality, consider inferior ischemia Abnormal ECG No significant change since last tracing Confirmed by Thayer Jew [phone]) on 12/18/2019 11:09:18 PM   Radiology No results found.  Procedures Procedures (including critical care time)  Medications Ordered in ED Medications  sodium chloride flush (NS) 0.9 % injection 3 mL (has no administration in time range)  sodium chloride 0.9 % bolus 1,000 mL (1,000 mLs Intravenous New Bag/Given 12/19/19 0142)    ED Course  I have reviewed the triage vital signs and the nursing notes.  Pertinent labs & imaging results that were available during my  care of the patient were reviewed by me and considered in my medical decision making (see chart for details).    MDM Rules/Calculators/A&P                       Patient presents with urinary retention.  Feels that she also has something protruding into her  vaginal wall.  She is overall nontoxic and vital signs are reassuring.  She is nontender in the abdomen.  She did not tolerate speculum exam but she does have fullness with likely cystocele the anterior wall of her vagina.  Foley was placed and she had greater than 700 cc.  Will maintain Foley and have her follow-up with urology.  She likely needs to see urogynecology as there is likely some element of pelvic floor dysfunction.  Labs were largely reassuring with exception of mild increase in creatinine.  She was given fluids.  Will discharge home with urology follow-up.  After history, exam, and medical workup I feel the patient has been appropriately medically screened and is safe for discharge home. Pertinent diagnoses were discussed with the patient. Patient was given return precautions.   Final Clinical Impression(s) / ED Diagnoses Final diagnoses:  Urinary retention  Female cystocele  AKI (acute kidney injury) [facility])    Rx / DC Orders ED Discharge Orders    None       Tangia Pinard, Barbette Hair, MD 12/19/19 [phone]

## 2019-12-19 NOTE — Discharge Instructions (Addendum)
You were seen today for urinary retention.  On exam you have evidence that your bladder is falling into your vaginal canal.  This could be some of the cause of your retention.  You need to follow-up with urology by the end of the week.  Maintain Foley catheter until that time.

## 2020-01-15 DIAGNOSIS — R3914 Feeling of incomplete bladder emptying: Secondary | ICD-10-CM | POA: Diagnosis not present

## 2020-01-23 DIAGNOSIS — R338 Other retention of urine: Secondary | ICD-10-CM | POA: Diagnosis not present

## 2020-01-23 DIAGNOSIS — R3914 Feeling of incomplete bladder emptying: Secondary | ICD-10-CM | POA: Diagnosis not present

## 2020-02-08 DIAGNOSIS — R200 Anesthesia of skin: Secondary | ICD-10-CM | POA: Diagnosis not present

## 2020-02-12 DIAGNOSIS — R200 Anesthesia of skin: Secondary | ICD-10-CM | POA: Diagnosis not present
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
## 2020-03-20 ENCOUNTER — Encounter: Payer: Self-pay | Admitting: *Deleted

## 2020-03-20 ENCOUNTER — Encounter: Payer: Self-pay | Admitting: Neurology

## 2020-03-20 ENCOUNTER — Ambulatory Visit (INDEPENDENT_AMBULATORY_CARE_PROVIDER_SITE_OTHER): Payer: Medicare Other | Admitting: Neurology

## 2020-03-20 ENCOUNTER — Other Ambulatory Visit: Payer: Self-pay

## 2020-03-20 VITALS — BP 101/72 | HR 85 | Ht 64.0 in | Wt 256.0 lb

## 2020-03-20 DIAGNOSIS — G629 Polyneuropathy, unspecified: Secondary | ICD-10-CM | POA: Insufficient documentation

## 2020-03-20 DIAGNOSIS — E538 Deficiency of other specified B group vitamins: Secondary | ICD-10-CM | POA: Diagnosis not present

## 2020-03-20 DIAGNOSIS — Z131 Encounter for screening for diabetes mellitus: Secondary | ICD-10-CM | POA: Diagnosis not present

## 2020-03-20 NOTE — Patient Instructions (Addendum)
Blood work Will get records from the [place] and see what other labs she may need   Peripheral Neuropathy Peripheral neuropathy is a type of nerve damage. It affects nerves that carry signals between the spinal cord and the arms, legs, and the rest of the body (peripheral nerves). It does not affect nerves in the spinal cord or brain. In peripheral neuropathy, one nerve or a group of nerves may be damaged. Peripheral neuropathy is a broad category that includes many specific nerve disorders, like diabetic neuropathy, hereditary neuropathy, and carpal tunnel syndrome. What are the causes? This condition may be caused by:  Diabetes. This is the most common cause of peripheral neuropathy.  Nerve injury.  Pressure or stress on a nerve that lasts a long time.  Lack (deficiency) of B vitamins. This can result from alcoholism, poor diet, or a restricted diet.  Infections.  Autoimmune diseases, such as rheumatoid arthritis and systemic lupus erythematosus.  Nerve diseases that are passed from parent to child (inherited).  Some medicines, such as cancer medicines (chemotherapy).  Poisonous (toxic) substances, such as lead and mercury.  Too little blood flowing to the legs.  Kidney disease.  Thyroid disease. In some cases, the cause of this condition is not known. What are the signs or symptoms? Symptoms of this condition depend on which of your nerves is damaged. Common symptoms include:  Loss of feeling (numbness) in the feet, hands, or both.  Tingling in the feet, hands, or both.  Burning pain.  Very sensitive skin.  Weakness.  Not being able to move a part of the body (paralysis).  Muscle twitching.  Clumsiness or poor coordination.  Loss of balance.  Not being able to control your bladder.  Feeling dizzy.  Sexual problems. How is this diagnosed? Diagnosing and finding the cause of peripheral neuropathy can be difficult. Your health care provider will take your  medical history and do a physical exam. A neurological exam will also be done. This involves checking things that are affected by your brain, spinal cord, and nerves (nervous system). For example, your health care provider will check your reflexes, how you move, and what you can feel. You may have other tests, such as:  Blood tests.  Electromyogram (EMG) and nerve conduction tests. These tests check nerve function and how well the nerves are controlling the muscles.  Imaging tests, such as CT scans or MRI to rule out other causes of your symptoms.  Removing a small piece of nerve to be examined in a lab (nerve biopsy). This is rare.  Removing and examining a small amount of the fluid that surrounds the brain and spinal cord (lumbar puncture). This is rare. How is this treated? Treatment for this condition may involve:  Treating the underlying cause of the neuropathy, such as diabetes, kidney disease, or vitamin deficiencies.  Stopping medicines that can cause neuropathy, such as chemotherapy.  Medicine to relieve pain. Medicines may include: ? Prescription or over-the-counter pain medicine. ? Antiseizure medicine. ? Antidepressants. ? Pain-relieving patches that are applied to painful areas of skin.  Surgery to relieve pressure on a nerve or to destroy a nerve that is causing pain.  Physical therapy to help improve movement and balance.  Devices to help you move around (assistive devices). Follow these instructions at home: Medicines  Take over-the-counter and prescription medicines only as told by your health care provider. Do not take any other medicines without first asking your health care provider.  Do not drive or use  heavy machinery while taking prescription pain medicine. Lifestyle   Do not use any products that contain nicotine or tobacco, such as cigarettes and e-cigarettes. Smoking keeps blood from reaching damaged nerves. If you need help quitting, ask your health  care provider.  Avoid or limit alcohol. Too much alcohol can cause a vitamin B deficiency, and vitamin B is needed for healthy nerves.  Eat a healthy diet. This includes: ? Eating foods that are high in fiber, such as fresh fruits and vegetables, whole grains, and beans. ? Limiting foods that are high in fat and processed sugars, such as fried or sweet foods. General instructions   If you have diabetes, work closely with your health care provider to keep your blood sugar under control.  If you have numbness in your feet: ? Check every day for signs of injury or infection. Watch for redness, warmth, and swelling. ? Wear padded socks and comfortable shoes. These help protect your feet.  Develop a good support system. Living with peripheral neuropathy can be stressful. Consider talking with a mental health specialist or joining a support group.  Use assistive devices and attend physical therapy as told by your health care provider. This may include using a walker or a cane.  Keep all follow-up visits as told by your health care provider. This is important. Contact a health care provider if:  You have new signs or symptoms of peripheral neuropathy.  You are struggling emotionally from dealing with peripheral neuropathy.  Your pain is not well-controlled. Get help right away if:  You have an injury or infection that is not healing normally.  You develop new weakness in an arm or leg.  You fall frequently. Summary  Peripheral neuropathy is when the nerves in the arms, or legs are damaged, resulting in numbness, weakness, or pain.  There are many causes of peripheral neuropathy, including diabetes, pinched nerves, vitamin deficiencies, autoimmune disease, and hereditary conditions.  Diagnosing and finding the cause of peripheral neuropathy can be difficult. Your health care provider will take your medical history, do a physical exam, and do tests, including blood tests and nerve  function tests.  Treatment involves treating the underlying cause of the neuropathy and taking medicines to help control pain. Physical therapy and assistive devices may also help. This information is not intended to replace advice given to you by your health care provider. Make sure you discuss any questions you have with your health care provider. Document Revised: 10/01/2017 Document Reviewed: 12/28/2016 Elsevier Patient Education  2020 [employer].

## 2020-03-22 ENCOUNTER — Ambulatory Visit: Payer: No Typology Code available for payment source | Attending: Nurse Practitioner | Admitting: Nurse Practitioner

## 2020-03-22 ENCOUNTER — Other Ambulatory Visit: Payer: Self-pay

## 2020-03-22 DIAGNOSIS — M5416 Radiculopathy, lumbar region: Secondary | ICD-10-CM | POA: Diagnosis not present

## 2020-03-22 DIAGNOSIS — E559 Vitamin D deficiency, unspecified: Secondary | ICD-10-CM | POA: Diagnosis not present

## 2020-03-22 DIAGNOSIS — G629 Polyneuropathy, unspecified: Secondary | ICD-10-CM

## 2020-03-22 DIAGNOSIS — E876 Hypokalemia: Secondary | ICD-10-CM

## 2020-03-22 NOTE — Progress Notes (Signed)
Having pain in feet and back.

## 2020-03-22 NOTE — Progress Notes (Signed)
Virtual Visit via Telephone Note Due to national recommendations of social distancing due to [place] 19, telehealth visit is felt to be most appropriate for this patient at this time.  I discussed the limitations, risks, security and privacy concerns of performing an evaluation and management service by telephone and the availability of in person appointments. I also discussed with the patient that there may be a patient responsible charge related to this service. The patient expressed understanding and agreed to proceed.    I connected with Holly Cook on 03/22/20  at   1:50 PM EDT  EDT by telephone and verified that I am speaking with the correct person using two identifiers.   Consent I discussed the limitations, risks, security and privacy concerns of performing an evaluation and management service by telephone and the availability of in person appointments. I also discussed with the patient that there may be a patient responsible charge related to this service. The patient expressed understanding and agreed to proceed.   Location of Patient: Private Residence    Location of Provider: [place] [facility] [employer] Office    Persons participating in Telemedicine visit: [name] FNP-BC [place]    History of Present Illness: Telemedicine visit for: Establish Care  has a past medical history of CAD (coronary artery disease), Diabetes mellitus without complication ([place]), Sleep apnea (She does not use her CPAP machine. States it makes her feel suffocated), and Stroke ([place]) (2019).   Recently diagnosed with small fiber neuropathy in feet and legs. Seeing Neurology. Can only walk 3-4 minutes before her feet and legs get numb. A1c normal. She uses a walker for close distances and uses scooter for long distances. She has a history of lumbar radiculopathy, Lumbar stenosis with neurogenic claudication and  chronic midline low back pain with bilateral sciatica. Past  EMG-NCV negative for diffuse peripheral neuropathy or active nerve denervation. Has tried gabapentin in the past 300 mg TID which she states was ineffective. Associated symptoms include pain and swelling in her feet which she was advised to follow up with PCP to evaluate for gout. She had back surgery in 2014 for lumbar fusion.  PER NEUROSURGERY:  The patient has remote history of L4/5 fusion and while she has documented adjacent level stenosis at the L3/4 level, I am hesitant to proceed with surgical intervention while she remains in such a deconditioned state knowing that surgery itself will add to her deconditioning. In instead encourage ongoing participation in her PT/Rehab with follow-up with me prn. Today she states that she has tried aqua therapy briefly and states she had to stop due to extensive pain to her feet with weight bearing on the concrete pool floor Lab Results  Component Value Date   HGBA1C 5.3 03/20/2020   She would like a second opinion from Neurosurgery. Does not feel physical therapy has been beneficial.   Past Medical History:  Diagnosis Date  . CAD (coronary artery disease)   . Diabetes mellitus without complication [facility])    patient states she was told this one but time but she doesn't have it  . Sleep apnea   . Stroke [facility]) 2019   "slight stroke"     Past Surgical History:  Procedure Laterality Date  . BACK SURGERY     lumbar spine  . HERNIA REPAIR    . wrist and arm surgery      Family History  Problem Relation Age of Onset  . Neuropathy Neg Hx  Social History   Socioeconomic History  . Marital status: Married    Spouse name: Not on file  . Number of children: Not on file  . Years of education: Not on file  . Highest education level: Not on file  Occupational History  . Not on file  Tobacco Use  . Smoking status: Never Smoker  . Smokeless tobacco: Never Used  Substance and Sexual Activity  . Alcohol use: No  . Drug use: No  . Sexual  activity: Yes    Birth control/protection: None  Other Topics Concern  . Not on file  Social History Narrative   Lives in independent living at [facility]    Right handed   Caffeine: maybe 2-3 cups daily   Social Determinants of Health   Financial Resource Strain:   . Difficulty of Paying Living Expenses:   Food Insecurity:   . Worried About Charity fundraiser in the Last Year:   . Arboriculturist in the Last Year:   Transportation Needs:   . Film/video editor (Medical):   Marland Kitchen Lack of Transportation (Non-Medical):   Physical Activity:   . Days of Exercise per Week:   . Minutes of Exercise per Session:   Stress:   . Feeling of Stress :   Social Connections:   . Frequency of Communication with Friends and Family:   . Frequency of Social Gatherings with Friends and Family:   . Attends Religious Services:   . Active Member of Clubs or Organizations:   . Attends Archivist Meetings:   Marland Kitchen Marital Status:      Observations/Objective: Awake, alert and oriented x 3   Review of Systems  Constitutional: Negative for fever, malaise/fatigue and weight loss.  HENT: Negative.  Negative for nosebleeds.   Eyes: Negative.  Negative for blurred vision, double vision and photophobia.  Respiratory: Negative.  Negative for cough and shortness of breath.   Cardiovascular: Positive for leg swelling. Negative for chest pain and palpitations.  Gastrointestinal: Negative.  Negative for heartburn, nausea and vomiting.  Musculoskeletal: Positive for back pain. Negative for myalgias.  Neurological: Positive for tingling and sensory change. Negative for dizziness, focal weakness, seizures and headaches.  Psychiatric/Behavioral: Negative.  Negative for suicidal ideas.    Assessment and Plan: Weston was seen today for new patient (initial visit).  Diagnoses and all orders for this visit:  Small fiber neuropathy -     Ambulatory referral to Neurosurgery  Lumbar radiculopathy -      Ambulatory referral to Neurosurgery  Hypokalemia -     CMP14+EGFR  Vitamin D deficiency disease -     VITAMIN D 25 Hydroxy (Vit-D Deficiency, Fractures)     Follow Up Instructions Return in about 3 months (around 06/22/2020).     I discussed the assessment and treatment plan with the patient. The patient was provided an opportunity to ask questions and all were answered. The patient agreed with the plan and demonstrated an understanding of the instructions.   The patient was advised to call back or seek an in-person evaluation if the symptoms worsen or if the condition fails to improve as anticipated.  I provided 20 minutes of non-face-to-face time during this encounter including median intraservice time, reviewing previous notes, labs, imaging, medications and explaining diagnosis and management.  Gildardo Pounds, FNP-BC

## 2020-03-23 ENCOUNTER — Encounter: Payer: Self-pay | Admitting: Nurse Practitioner

## 2020-03-23 LAB — CMP14+EGFR
ALT: 22 IU/L (ref 0–32)
AST: 25 IU/L (ref 0–40)
Albumin/Globulin Ratio: 1.4 (ref 1.2–2.2)
Albumin: 4.2 g/dL (ref 3.8–4.9)
Alkaline Phosphatase: 138 IU/L — ABNORMAL HIGH (ref 48–121)
BUN/Creatinine Ratio: 11 (ref 9–23)
BUN: 12 mg/dL (ref 6–24)
Bilirubin Total: 0.4 mg/dL (ref 0.0–1.2)
CO2: 23 mmol/L (ref 20–29)
Calcium: 9.8 mg/dL (ref 8.7–10.2)
Chloride: 105 mmol/L (ref 96–106)
Creatinine, Ser: 1.11 mg/dL — ABNORMAL HIGH (ref 0.57–1.00)
GFR calc Af Amer: 65 mL/min/{1.73_m2} (ref >59)
GFR calc non Af Amer: 56 mL/min/{1.73_m2} — ABNORMAL LOW (ref >59)
Globulin, Total: 2.9 g/dL (ref 1.5–4.5)
Glucose: 91 mg/dL (ref 65–99)
Potassium: 4.2 mmol/L (ref 3.5–5.2)
Sodium: 144 mmol/L (ref 134–144)
Total Protein: 7.1 g/dL (ref 6.0–8.5)

## 2020-03-23 LAB — VITAMIN D 25 HYDROXY (VIT D DEFICIENCY, FRACTURES): Vit D, 25-Hydroxy: 27.4 ng/mL — ABNORMAL LOW (ref 30.0–100.0)

## 2020-03-23 MED ORDER — ATORVASTATIN CALCIUM 20 MG PO TABS: 20.0000 mg | ORAL_TABLET | Freq: Every day | ORAL | 3 refills | Status: DC

## 2020-03-23 MED ORDER — PREGABALIN 50 MG PO CAPS: 50.0000 mg | ORAL_CAPSULE | Freq: Three times a day (TID) | ORAL | 1 refills | Status: DC

## 2020-03-23 MED ORDER — CITALOPRAM HYDROBROMIDE 40 MG PO TABS: 40.0000 mg | ORAL_TABLET | Freq: Every day | ORAL | 2 refills | Status: DC

## 2020-03-23 MED ORDER — CYCLOBENZAPRINE HCL 10 MG PO TABS: 10.0000 mg | ORAL_TABLET | Freq: Two times a day (BID) | ORAL | 1 refills | Status: DC | PRN

## 2020-03-25 ENCOUNTER — Telehealth: Payer: Self-pay | Admitting: *Deleted

## 2020-03-25 NOTE — Telephone Encounter (Signed)
-----   Message from Melvenia Beam, MD sent at 03/21/2020  9:55 AM EDT ----- So far labs normal, pending a few others and we will call her if anything is abnormal on remaining blood work thanks

## 2020-03-25 NOTE — Telephone Encounter (Signed)
Called pt & LVM asking for call back. When the pt calls back, please let her know so far labs are normal. We are waiting on a few others and we will call if anything is abnormal.

## 2020-03-26 LAB — METHYLMALONIC ACID, SERUM: Methylmalonic Acid: 278 nmol/L (ref 0–378)

## 2020-03-26 LAB — HEMOGLOBIN A1C
Est. average glucose Bld gHb Est-mCnc: 105 mg/dL
Hgb A1c MFr Bld: 5.3 % (ref 4.8–5.6)

## 2020-03-26 LAB — VITAMIN B12: Vitamin B-12: 310 pg/mL (ref 232–1245)

## 2020-03-27 ENCOUNTER — Other Ambulatory Visit: Payer: Self-pay | Admitting: Nurse Practitioner

## 2020-03-27 MED ORDER — ATORVASTATIN CALCIUM 20 MG PO TABS: 20.0000 mg | ORAL_TABLET | Freq: Every day | ORAL | 3 refills | Status: DC

## 2020-03-27 NOTE — Telephone Encounter (Signed)
I spoke with the pt and let her know that her labs were normal. I answered her questions. Pt verbalized appreciation for the call.

## 2020-03-29 ENCOUNTER — Other Ambulatory Visit: Payer: Self-pay | Admitting: Nurse Practitioner

## 2020-03-29 MED ORDER — CITALOPRAM HYDROBROMIDE 40 MG PO TABS: 40.0000 mg | ORAL_TABLET | Freq: Every day | ORAL | 2 refills | Status: DC

## 2020-03-29 MED ORDER — PREGABALIN 50 MG PO CAPS: 50.0000 mg | ORAL_CAPSULE | Freq: Three times a day (TID) | ORAL | 1 refills | Status: DC

## 2020-03-29 MED ORDER — CYCLOBENZAPRINE HCL 10 MG PO TABS: 10.0000 mg | ORAL_TABLET | Freq: Two times a day (BID) | ORAL | 1 refills | Status: AC | PRN

## 2020-05-07 ENCOUNTER — Telehealth: Payer: Self-pay | Admitting: Nurse Practitioner

## 2020-05-07 NOTE — Telephone Encounter (Signed)
Please advise.  Copied from [place] [id number]. Topic: Referral - Status >> May 02, 2020 12:22 PM Scherrie Gerlach wrote: Reason for CRM: pt referred by dr to [facility] .  They told her the Xrays she has on file are too old and she needs to bring new xrays with her to appt. Pt states she needs her dr to order.

## 2020-05-09 ENCOUNTER — Other Ambulatory Visit: Payer: Self-pay | Admitting: Nurse Practitioner

## 2020-05-09 DIAGNOSIS — G629 Polyneuropathy, unspecified: Secondary | ICD-10-CM

## 2020-05-09 DIAGNOSIS — M5416 Radiculopathy, lumbar region: Secondary | ICD-10-CM

## 2020-05-09 NOTE — Telephone Encounter (Signed)
Spoke to patient and informed on X-Ray order are placed in.  Pt. Understood.

## 2020-05-09 NOTE — Telephone Encounter (Signed)
X-rays have been ordered.

## 2020-05-14 ENCOUNTER — Ambulatory Visit (HOSPITAL_COMMUNITY)
Admission: RE | Admit: 2020-05-14 | Discharge: 2020-05-14 | Disposition: A | Discharge status: Home / Self Care | Payer: Medicare HMO | Source: Ambulatory Visit | Attending: Nurse Practitioner | Admitting: Nurse Practitioner

## 2020-05-14 DIAGNOSIS — G629 Polyneuropathy, unspecified: Secondary | ICD-10-CM | POA: Diagnosis present

## 2020-05-14 DIAGNOSIS — M5416 Radiculopathy, lumbar region: Secondary | ICD-10-CM | POA: Insufficient documentation

## 2020-06-28 ENCOUNTER — Ambulatory Visit: Payer: No Typology Code available for payment source | Admitting: Nurse Practitioner

## 2020-07-23 ENCOUNTER — Telehealth: Payer: Self-pay | Admitting: Nurse Practitioner

## 2020-07-23 NOTE — Telephone Encounter (Signed)
Noted  Referral sent back thru [employer]  to [facility]

## 2020-07-23 NOTE — Telephone Encounter (Signed)
Javanna from [facility] called saying patient went to there facility requesting to be seen and Aram Candela states that they do not have a referral. It looks that the referral was sent back in May. Aram Candela states if the referral can be resent with office notes. Please f/u

## 2020-07-31 ENCOUNTER — Telehealth: Payer: Self-pay | Admitting: Nurse Practitioner

## 2020-07-31 NOTE — Telephone Encounter (Signed)
Copied from [place] [id number]. Topic: General - Call Back - No Documentation >> Jul 26, 2020 12:14 PM Erick Blinks wrote: Reason for CRM: Pt wants PCP to know that she is in severe pain, her feet have nerve damage and she needs pain medication. Declined appt, says she cannot sleep at night because the pain is unbearable. Best contact: [phone]

## 2020-07-31 NOTE — Telephone Encounter (Signed)
Patient informed that the medications below were sent to the pharmacy in May. She was to d/c gabapentin and f/u with Neurosurgery.She states she has not did the f/u and will call them to do so. Has to travel out of town b/c they are taking her sister off life support.  She will call [facility] to f/u on Rx.   pregabalin (LYRICA) 50 MG capsule  cyclobenzaprine (FLEXERIL) 10 MG tablet  citalopram (CELEXA) 40 MG tablet

## 2020-09-03 ENCOUNTER — Telehealth: Payer: Self-pay | Admitting: Nurse Practitioner

## 2020-09-03 NOTE — Telephone Encounter (Signed)
Patient called to request a referral for a dermatologist for her scalp. CB# [phone]

## 2020-09-06 NOTE — Telephone Encounter (Signed)
Attempt to call patient back regarding her scalp for the referral request. No answer and LVM.

## 2020-09-06 NOTE — Telephone Encounter (Signed)
Pt is returning the call. Pt is having hair loss and scalp is  very very dry,  Itching and scalp is white all over

## 2020-09-06 NOTE — Telephone Encounter (Signed)
Needs office visit.

## 2020-09-12 NOTE — Telephone Encounter (Signed)
Attempt to reach patient to inform she will need to schedule an OV. No answer and LVM.

## 2020-09-23 NOTE — Progress Notes (Signed)
Patient did not show for appointment.   

## 2020-09-24 ENCOUNTER — Encounter: Payer: No Typology Code available for payment source | Admitting: Family

## 2020-10-24 ENCOUNTER — Other Ambulatory Visit: Payer: Self-pay

## 2020-10-24 ENCOUNTER — Ambulatory Visit: Payer: No Typology Code available for payment source | Attending: Family | Admitting: Physician Assistant

## 2020-10-24 ENCOUNTER — Encounter: Payer: Self-pay | Admitting: Physician Assistant

## 2020-10-24 VITALS — BP 109/74 | HR 71 | Temp 98.4°F | Ht 65.0 in | Wt 271.0 lb

## 2020-10-24 DIAGNOSIS — L659 Nonscarring hair loss, unspecified: Secondary | ICD-10-CM | POA: Diagnosis not present

## 2020-10-24 DIAGNOSIS — R21 Rash and other nonspecific skin eruption: Secondary | ICD-10-CM | POA: Diagnosis not present

## 2020-10-24 NOTE — Progress Notes (Signed)
Patient ID: Holly Cook, female   DOB: Jun 29, 1966, 54 y.o.   MRN: [id number]   Holly Cook, is a 54 y.o. female  [id number]  [id number]  DOB - 1966/10/29  Subjective:  Chief Complaint and HPI: Holly Cook is a 54 y.o. female here today for occasionally itchy scalp with dandruff, chronic dry scalp, and hair loss/breaking for about 4 months.  She "greases" her scalp regularly (which she has always done).  No one in the family/household affected.  No rash anywhere else.  She has tried hydrocortisone cream and multiple dandruff shampoo and conditioner types without success.     ROS:   Constitutional:  No f/c, No night sweats, No unexplained weight loss. EENT:  No vision changes, No blurry vision, No hearing changes. No mouth, throat, or ear problems.  Respiratory: No cough, No SOB Cardiac: No CP, no palpitations GI:  No abd pain, No N/V/D. GU: No Urinary s/sx Musculoskeletal: No joint pain Neuro: No headache, no dizziness, no motor weakness.  Skin: see above Endocrine:  No polydipsia. No polyuria.  Psych: Denies SI/HI  No problems updated.  ALLERGIES: Allergies  Allergen Reactions  . Tetanus Toxoids Other (See Comments)    Flu-like symptoms, injection site reaction; treated with antibiotics.  Not certain if reaction to vaccine or infection of injection site    PAST MEDICAL HISTORY: Past Medical History:  Diagnosis Date  . CAD (coronary artery disease)   . Diabetes mellitus without complication [facility])    patient states she was told this one but time but she doesn't have it  . Sleep apnea   . Stroke [facility]) 2019   "slight stroke"     MEDICATIONS AT HOME: Prior to Admission medications   Medication Sig Start Date End Date Taking? Authorizing Provider  albuterol (PROVENTIL HFA;VENTOLIN HFA) 108 (90 Base) MCG/ACT inhaler Inhale 1-2 puffs into the lungs every 6 (six) hours as needed for wheezing or shortness of breath.   Yes [provider]  citalopram (CELEXA)  40 MG tablet Take 1 tablet (40 mg total) by mouth daily. 03/29/20  Yes Gildardo Pounds, NP  cyclobenzaprine (FLEXERIL) 10 MG tablet Take 1 tablet (10 mg total) by mouth 2 (two) times daily as needed for muscle spasms. 03/29/20  Yes Gildardo Pounds, NP  ondansetron (ZOFRAN ODT) 8 MG disintegrating tablet Take 1 tablet (8 mg total) by mouth every 8 (eight) hours as needed for nausea or vomiting. 10/08/13  Yes Dorie Rank, MD  pregabalin (LYRICA) 50 MG capsule Take 1 capsule (50 mg total) by mouth 3 (three) times daily. 03/29/20  Yes Gildardo Pounds, NP  Vitamin D, Ergocalciferol, (DRISDOL) 50000 units CAPS capsule Take 50,000 Units by mouth every 7 (seven) days.   Yes [provider]  vitamin E 180 MG (400 UNITS) capsule Take 400 Units by mouth daily.   Yes [provider]  aspirin 81 MG chewable tablet Chew 1 tablet (81 mg total) by mouth daily. Patient not taking: Reported on 10/24/2020 04/23/16   Lenore Cordia, MD  atorvastatin (LIPITOR) 20 MG tablet Take 1 tablet (20 mg total) by mouth daily at 6 PM. 03/27/20 06/25/20  Gildardo Pounds, NP     Objective:  EXAM:   Vitals:   10/24/20 1123  BP: 109/74  Pulse: 71  Temp: 98.4 F (36.9 C)  TempSrc: Oral  SpO2: 95%  Weight: 271 lb (122.9 kg)  Height: 5\' 5"  (1.651 m)    General appearance : A&OX3. NAD. Non-toxic-appearing HEENT:  Atraumatic and Normocephalic.  PERRLA. EOM intact.   Scalp/hair-there are many broken hairs and a couple of areas in the front with thinning hair.  Further back the hair is very thick.  There are TNTC dandruff/flaky skin on her scalp and even on her shirt.  No nits appreciated.  Scalp wihtout obvious rash but difficult to visualize.  Chest/Lungs:  Breathing-non-labored, Good air entry bilaterally, breath sounds normal without rales, rhonchi, or wheezing  CVS: S1 S2 regular, no murmurs, gallops, rubs  Neurology:  CN II-XII grossly intact, Non focal.   Psych:  TP linear. J/I WNL. Normal speech.  Appropriate eye contact and affect.  Skin:  See above;  No other rash noted  Data Review Lab Results  Component Value Date   HGBA1C 5.3 03/20/2020   HGBA1C 5.8 (H) 04/23/2016   HGBA1C (H) 01/12/2011    6.0 (NOTE)                                                                       According to the ADA Clinical Practice Recommendations for 2011, when HbA1c is used as a screening test:   >=6.5%   Diagnostic of Diabetes Mellitus           (if abnormal result  is confirmed)  5.7-6.4%   Increased risk of developing Diabetes Mellitus  References:Diagnosis and Classification of Diabetes Mellitus,Diabetes GNFA,2130,86(VHQIO 1):S62-S69 and Standards of Medical Care in         Diabetes - 2011,Diabetes Care,2011,34  (Suppl 1):S11-S61.     Assessment & Plan   1. Hair loss - TSH - Ambulatory referral to Dermatology  2. Rash and nonspecific skin eruption She can try head and shoulders  - Ambulatory referral to Dermatology   Patient have been counseled extensively about nutrition and exercise  Return for see PCP as next due.  The patient was given clear instructions to go to ER or return to medical center if symptoms don't improve, worsen or new problems develop. The patient verbalized understanding. The patient was told to call to get lab results if they haven't heard anything in the next week.     Freeman Caldron, PA-C [facility] [place], [place]   10/24/2020, 12:40 PM

## 2020-10-25 LAB — TSH: TSH: 3.1 u[IU]/mL (ref 0.450–4.500)

## 2020-11-08 ENCOUNTER — Telehealth: Payer: Self-pay | Admitting: Nurse Practitioner

## 2020-11-08 NOTE — Telephone Encounter (Signed)
Referral was sent to [facility] and still in review  for them is close because is outgoing referral please, see referral notes . Thank you

## 2020-11-08 NOTE — Telephone Encounter (Signed)
Called patient and let her know referral was placed and that she needed to get in touch with [facility]. Patient understood.

## 2020-11-08 NOTE — Telephone Encounter (Signed)
Copied from [place] [phone]. Topic: General - Inquiry >> Nov 05, 2020 11:35 AM Greggory Keen D wrote: Reason for CRM: pt called asking about her referral to dermatology.   States she was told by Dr. Thereasa Solo she would be referred to dermatology when she was seen on the 23rd, dec.Marland Kitchen  Pt # [phone]   See where patient had dermatology referral from [facility] but status is closed. Please follow up.

## 2020-11-11 NOTE — Progress Notes (Signed)
[place], [place] - [place] [phone] [place] [address] Phone: [phone] Fax: [phone]      Your procedure is scheduled on 11/14/2020.  Report to [facility] Main Entrance "A" at 08:00 A.M., and check in at the Admitting office.  Call this number if you have problems the morning of surgery:  [phone]  Call [phone] if you have any questions prior to your surgery date Monday-Friday 8am-4pm    Remember:  Do not eat or drink after midnight the night before your surgery     Take these medicines the morning of surgery with A SIP OF WATER  albuterol (PROVENTIL HFA;VENTOLIN HFA) -if needed carvedilol (COREG)  cyclobenzaprine (FLEXERIL)- if needed levETIRAcetam (KEPPRA) pantoprazole (PROTONIX) sertraline (ZOLOFT)  As of today, STOP taking any Aspirin (unless otherwise instructed by your surgeon) Aleve, Naproxen, Ibuprofen, Motrin, Advil, Goody's, BC's, all herbal medications, fish oil, and all vitamins.   HOW TO MANAGE YOUR DIABETES BEFORE AND AFTER SURGERY  Why is it important to control my blood sugar before and after surgery? . Improving blood sugar levels before and after surgery helps healing and can limit problems. . A way of improving blood sugar control is eating a healthy diet by: o  Eating less sugar and carbohydrates o  Increasing activity/exercise o  Talking with your doctor about reaching your blood sugar goals . High blood sugars (greater than 180 mg/dL) can raise your risk of infections and slow your recovery, so you will need to focus on controlling your diabetes during the weeks before surgery. . Make sure that the doctor who takes care of your diabetes knows about your planned surgery including the date and location.  How do I manage my blood sugar before surgery? . Check your blood sugar at least 4 times a day, starting 2 days before surgery, to make sure that the  level is not too high or low. . Check your blood sugar the morning of your surgery when you wake up and every 2 hours until you get to the Short Stay unit. o If your blood sugar is less than 70 mg/dL, you will need to treat for low blood sugar: - Do not take insulin. - Treat a low blood sugar (less than 70 mg/dL) with  cup of clear juice (cranberry or apple), 4 glucose tablets, OR glucose gel. - Recheck blood sugar in 15 minutes after treatment (to make sure it is greater than 70 mg/dL). If your blood sugar is not greater than 70 mg/dL on recheck, call [phone] for further instructions. . Report your blood sugar to the short stay nurse when you get to Short Stay.  . If you are admitted to the hospital after surgery: o Your blood sugar will be checked by the staff and you will probably be given insulin after surgery (instead of oral diabetes medicines) to make sure you have good blood sugar levels. o The goal for blood sugar control after surgery is 80-180 mg/dL.                       Do not wear jewelry, make up, or nail polish            Do not wear lotions, powders, perfumes/colognes, or deodorant.            Do not shave 48 hours prior to surgery.  Men may shave face and neck.  Do not bring valuables to the hospital.            [facility] is not responsible for any belongings or valuables.  Do NOT Smoke (Tobacco/Vaping) or drink Alcohol 24 hours prior to your procedure If you use a CPAP at night, you may bring all equipment for your overnight stay.   Contacts, glasses, dentures or bridgework may not be worn into surgery.      For patients admitted to the hospital, discharge time will be determined by your treatment team.   Patients discharged the day of surgery will not be allowed to drive home, and someone needs to stay with them for 24 hours.    Special instructions:   [place]- Preparing For Surgery  Before surgery, you can play an important role. Because  skin is not sterile, your skin needs to be as free of germs as possible. You can reduce the number of germs on your skin by washing with CHG (chlorahexidine gluconate) Soap before surgery.  CHG is an antiseptic cleaner which kills germs and bonds with the skin to continue killing germs even after washing.    Oral Hygiene is also important to reduce your risk of infection.  Remember - BRUSH YOUR TEETH THE MORNING OF SURGERY WITH YOUR REGULAR TOOTHPASTE  Please do not use if you have an allergy to CHG or antibacterial soaps. If your skin becomes reddened/irritated stop using the CHG.  Do not shave (including legs and underarms) for at least 48 hours prior to first CHG shower. It is OK to shave your face.  Please follow these instructions carefully.   1. Shower the NIGHT BEFORE SURGERY and the MORNING OF SURGERY with CHG Soap.   2. If you chose to wash your hair, wash your hair first as usual with your normal shampoo.  3. After you shampoo, rinse your hair and body thoroughly to remove the shampoo.  4. Use CHG as you would any other liquid soap. You can apply CHG directly to the skin and wash gently with a scrungie or a clean washcloth.   5. Apply the CHG Soap to your body ONLY FROM THE NECK DOWN.  Do not use on open wounds or open sores. Avoid contact with your eyes, ears, mouth and genitals (private parts). Wash Face and genitals (private parts)  with your normal soap.   6. Wash thoroughly, paying special attention to the area where your surgery will be performed.  7. Thoroughly rinse your body with warm water from the neck down.  8. DO NOT shower/wash with your normal soap after using and rinsing off the CHG Soap.  9. Pat yourself dry with a CLEAN TOWEL.  10. Wear CLEAN PAJAMAS to bed the night before surgery  11. Place CLEAN SHEETS on your bed the night of your first shower and DO NOT SLEEP WITH PETS.   Day of Surgery: Wear Clean/Comfortable clothing the morning of surgery Do not  apply any deodorants/lotions.   Remember to brush your teeth WITH YOUR REGULAR TOOTHPASTE.   Please read over the following fact sheets that you were given.

## 2020-11-12 ENCOUNTER — Inpatient Hospital Stay (HOSPITAL_COMMUNITY)
Admission: RE | Admit: 2020-11-12 | Discharge: 2020-11-12 | Disposition: A | Discharge status: Home / Self Care | Payer: No Typology Code available for payment source | Source: Ambulatory Visit

## 2020-11-12 ENCOUNTER — Other Ambulatory Visit (HOSPITAL_COMMUNITY)
Admission: RE | Admit: 2020-11-12 | Discharge: 2020-11-12 | Disposition: A | Discharge status: Home / Self Care | Payer: No Typology Code available for payment source | Source: Ambulatory Visit | Attending: Neurosurgery | Admitting: Neurosurgery

## 2020-11-25 ENCOUNTER — Encounter (HOSPITAL_COMMUNITY): Payer: Self-pay | Admitting: Emergency Medicine

## 2020-11-25 ENCOUNTER — Emergency Department (HOSPITAL_COMMUNITY): Payer: No Typology Code available for payment source

## 2020-11-25 ENCOUNTER — Other Ambulatory Visit: Payer: Self-pay

## 2020-11-25 ENCOUNTER — Emergency Department (HOSPITAL_COMMUNITY)
Admission: EM | Admit: 2020-11-25 | Discharge: 2020-11-25 | Disposition: A | Discharge status: Home / Self Care | Payer: No Typology Code available for payment source | Attending: Emergency Medicine | Admitting: Emergency Medicine

## 2020-11-25 DIAGNOSIS — I2510 Atherosclerotic heart disease of native coronary artery without angina pectoris: Secondary | ICD-10-CM | POA: Diagnosis not present

## 2020-11-25 DIAGNOSIS — R569 Unspecified convulsions: Secondary | ICD-10-CM | POA: Insufficient documentation

## 2020-11-25 DIAGNOSIS — R519 Headache, unspecified: Secondary | ICD-10-CM | POA: Diagnosis not present

## 2020-11-25 DIAGNOSIS — H53149 Visual discomfort, unspecified: Secondary | ICD-10-CM | POA: Insufficient documentation

## 2020-11-25 DIAGNOSIS — Z7982 Long term (current) use of aspirin: Secondary | ICD-10-CM | POA: Insufficient documentation

## 2020-11-25 DIAGNOSIS — E119 Type 2 diabetes mellitus without complications: Secondary | ICD-10-CM | POA: Diagnosis not present

## 2020-11-25 LAB — CBC WITH DIFFERENTIAL/PLATELET
Abs Immature Granulocytes: 0.01 10*3/uL (ref 0.00–0.07)
Basophils Absolute: 0 10*3/uL (ref 0.0–0.1)
Basophils Relative: 0 %
Eosinophils Absolute: 0.1 10*3/uL (ref 0.0–0.5)
Eosinophils Relative: 1 %
HCT: 37.9 % (ref 36.0–46.0)
Hemoglobin: 12.7 g/dL (ref 12.0–15.0)
Immature Granulocytes: 0 %
Lymphocytes Relative: 42 %
Lymphs Abs: 1.8 10*3/uL (ref 0.7–4.0)
MCH: 26.7 pg (ref 26.0–34.0)
MCHC: 33.5 g/dL (ref 30.0–36.0)
MCV: 79.6 fL — ABNORMAL LOW (ref 80.0–100.0)
Monocytes Absolute: 0.4 10*3/uL (ref 0.1–1.0)
Monocytes Relative: 9 %
Neutro Abs: 2.1 10*3/uL (ref 1.7–7.7)
Neutrophils Relative %: 48 %
Platelets: 253 10*3/uL (ref 150–400)
RBC: 4.76 MIL/uL (ref 3.87–5.11)
RDW: 14.6 % (ref 11.5–15.5)
WBC: 4.4 10*3/uL (ref 4.0–10.5)
nRBC: 0 % (ref 0.0–0.2)

## 2020-11-25 LAB — COMPREHENSIVE METABOLIC PANEL
ALT: 49 U/L — ABNORMAL HIGH (ref 0–44)
AST: 66 U/L — ABNORMAL HIGH (ref 15–41)
Albumin: 3.5 g/dL (ref 3.5–5.0)
Alkaline Phosphatase: 116 U/L (ref 38–126)
Anion gap: 9 (ref 5–15)
BUN: 10 mg/dL (ref 6–20)
CO2: 26 mmol/L (ref 22–32)
Calcium: 8.8 mg/dL — ABNORMAL LOW (ref 8.9–10.3)
Chloride: 105 mmol/L (ref 98–111)
Creatinine, Ser: 1.04 mg/dL — ABNORMAL HIGH (ref 0.44–1.00)
GFR, Estimated: >60 mL/min (ref >60)
Glucose, Bld: 138 mg/dL — ABNORMAL HIGH (ref 70–99)
Potassium: 3.1 mmol/L — ABNORMAL LOW (ref 3.5–5.1)
Sodium: 140 mmol/L (ref 135–145)
Total Bilirubin: 0.6 mg/dL (ref 0.3–1.2)
Total Protein: 7.5 g/dL (ref 6.5–8.1)

## 2020-11-25 LAB — PROTIME-INR
INR: 1 (ref 0.8–1.2)
Prothrombin Time: 13.2 seconds (ref 11.4–15.2)

## 2020-11-25 LAB — CBG MONITORING, ED: Glucose-Capillary: 101 mg/dL — ABNORMAL HIGH (ref 70–99)

## 2020-11-25 LAB — APTT: aPTT: 32 seconds (ref 24–36)

## 2020-11-25 MED ORDER — DIPHENHYDRAMINE HCL 50 MG/ML IJ SOLN
25.0000 mg | Freq: Once | INTRAMUSCULAR | Status: AC
  Administered 2020-11-25: 25 mg via INTRAVENOUS
  Filled 2020-11-25: qty 1

## 2020-11-25 MED ORDER — KETOROLAC TROMETHAMINE 15 MG/ML IJ SOLN
30.0000 mg | Freq: Once | INTRAMUSCULAR | Status: AC
  Administered 2020-11-25: 30 mg via INTRAVENOUS
  Filled 2020-11-25: qty 2

## 2020-11-25 MED ORDER — SODIUM CHLORIDE 0.9 % IV BOLUS
1000.0000 mL | Freq: Once | INTRAVENOUS | Status: AC
  Administered 2020-11-25: 1000 mL via INTRAVENOUS

## 2020-11-25 MED ORDER — PROCHLORPERAZINE EDISYLATE 10 MG/2ML IJ SOLN
10.0000 mg | Freq: Once | INTRAMUSCULAR | Status: AC
  Administered 2020-11-25: 10 mg via INTRAVENOUS
  Filled 2020-11-25: qty 2

## 2020-11-25 MED ORDER — POTASSIUM CHLORIDE CRYS ER 20 MEQ PO TBCR
40.0000 meq | EXTENDED_RELEASE_TABLET | Freq: Once | ORAL | Status: AC
  Administered 2020-11-25: 40 meq via ORAL
  Filled 2020-11-25: qty 2

## 2020-11-25 NOTE — Discharge Instructions (Addendum)
Continue to take your home medications.  Follow-up with PCP in 1 week  Return for new or worsening symptoms.

## 2020-11-25 NOTE — ED Provider Notes (Signed)
[facility] DEPT Provider Note   CSN: [id number] Arrival date & time: 11/25/20  0421    History Chief Complaint  Patient presents with  . Headache    Holly Cook is a 55 y.o. female with past medical history significant for CAD, diabetes, complicated migraines, possible TIA, seizures on Keppra who presents for evaluation of headache.  Patient states she had a breakthrough seizure 3 days ago.  Has had headaches since.  She is not anticoagulated.  No recent trauma or injury.  She has taken Advil with mild improvement. Rate [place] a 10/10. Pain located to all over frontal head, Started on right side of head. NO tenderness to face, scalp, rashes or lesions. HA began after seizure 2 days ago. Hx of seizures.  Previously followed by neurology with the [place] however she has not been to see them in many years.  States they have continue to refill her Keppra.  She has not missed any doses.  She does not have history of epilepsy.  States her seizures consist of a "whoosh" across her head.  Lasts a second and then resolves.  No shaking, bladder incontinence, tongue biting, postictal phase.  Denies prior history of pseudoseizures.  She has been nauseous or has not vomited.  States she does not have history of migraines however seems in 2017 she was admitted for TIA versus complicated migraine.  Her headache resolved.  Per neuro and admitting team thought likely due to complicated migraine.  She denies any new paresthesias, chronic paresthesias to bilateral lower legs, spoke to have surgery in 1 week with Dr.Cabbell for lumbar fusion, facial droop, difficulty with word finding, neck pain, neck stiffness, lateral weakness.  States she does have photophobia however no phonophobia. No increase in her seizure activity.  Denies fever, chills, chest pain, shortness of breath abdominal pain, diarrhea, dysuria, hematuria.    Denies additional aggravating or alleviating factors.  History  obtained from patient and past medical records.  No interpreter used.  HPI     Past Medical History:  Diagnosis Date  . CAD (coronary artery disease)   . Diabetes mellitus without complication [facility])    patient states she was told this one but time but she doesn't have it  . Sleep apnea   . Stroke [facility]) 2019   "slight stroke"     Patient Active Problem List   Diagnosis Date Noted  . Small fiber neuropathy 03/20/2020  . TIA (transient ischemic attack) 04/22/2016    Past Surgical History:  Procedure Laterality Date  . BACK SURGERY     lumbar spine  . HERNIA REPAIR    . wrist and arm surgery       OB History    Gravida  2   Para  2   Term  2   Preterm  0   AB  0   Living  2     SAB  0   IAB  0   Ectopic  0   Multiple  0   Live Births              Family History  Problem Relation Age of Onset  . Neuropathy Neg Hx     Social History   Tobacco Use  . Smoking status: Never Smoker  . Smokeless tobacco: Never Used  Substance Use Topics  . Alcohol use: No  . Drug use: No    Home Medications Prior to Admission medications   Medication Sig Start Date  End Date Taking? Authorizing Provider  aspirin EC 81 MG tablet Take 81 mg by mouth every other day. Swallow whole.   Yes [provider]  atorvastatin (LIPITOR) 20 MG tablet Take 1 tablet (20 mg total) by mouth daily at 6 PM. 03/27/20 06/25/20 Yes Gildardo Pounds, NP  carvedilol (COREG) 25 MG tablet Take 12.5 mg by mouth 2 (two) times daily with a meal.   Yes [provider]  cyclobenzaprine (FLEXERIL) 10 MG tablet Take 1 tablet (10 mg total) by mouth 2 (two) times daily as needed for muscle spasms. 03/29/20  Yes Gildardo Pounds, NP  gabapentin (NEURONTIN) 300 MG capsule Take 300 mg by mouth at bedtime.   Yes [provider]  ibuprofen (ADVIL) 200 MG tablet Take 400 mg by mouth every 6 (six) hours as needed for mild pain, headache or fever.   Yes [provider]   levETIRAcetam (KEPPRA) 500 MG tablet Take 500 mg by mouth in the morning, at noon, and at bedtime.   Yes [provider]  montelukast (SINGULAIR) 10 MG tablet Take 10 mg by mouth at bedtime.   Yes [provider]  pantoprazole (PROTONIX) 40 MG tablet Take 40 mg by mouth daily.   Yes [provider]  sertraline (ZOLOFT) 100 MG tablet Take 200 mg by mouth daily.   Yes [provider]  traZODone (DESYREL) 100 MG tablet Take 50-100 mg by mouth at bedtime.   Yes [provider]    Allergies    Tetanus toxoids  Review of Systems   Review of Systems  Constitutional: Negative.   HENT: Negative.   Respiratory: Negative.   Cardiovascular: Negative.   Gastrointestinal: Positive for nausea. Negative for abdominal distention, abdominal pain, anal bleeding, blood in stool, constipation, diarrhea, rectal pain and vomiting.  Genitourinary: Negative.   Musculoskeletal: Negative.   Skin: Negative.   Neurological: Positive for seizures (2 days ago, per patient), numbness (Chronic BL lower extremity tingling, no saddle paresthesias, incontinence ) and headaches. Negative for dizziness, tremors, syncope, facial asymmetry, speech difficulty, weakness and light-headedness.  All other systems reviewed and are negative.   Physical Exam Updated Vital Signs BP 137/74 (BP Location: Left Arm)   Pulse 83   Temp 98.7 F (37.1 C) (Oral)   Resp 15   SpO2 96%   Physical Exam Physical Exam  Constitutional: Pt is oriented to person, place, and time. Pt appears well-developed and well-nourished. No distress.  HENT:  Head: Normocephalic and atraumatic.  Mouth/Throat: Oropharynx is clear and moist.  Eyes: Conjunctivae and EOM are normal. Pupils are equal, round, and reactive to light. No scleral icterus.  No horizontal, vertical or rotational nystagmus  Neck: Normal range of motion. Neck supple.  Full active and passive ROM without pain No midline or paraspinal  tenderness No nuchal rigidity or meningeal signs  Cardiovascular: Normal rate, regular rhythm and intact distal pulses.   Pulmonary/Chest: Effort normal and breath sounds normal. No respiratory distress. Pt has no wheezes. No rales.  Abdominal: Soft. Bowel sounds are normal. There is no tenderness. There is no rebound and no guarding.  Musculoskeletal: Normal range of motion.  Lymphadenopathy:    No cervical adenopathy.  Neurological: Pt. is alert and oriented to person, place, and time. He has normal reflexes. No cranial nerve deficit.  Exhibits normal muscle tone. Coordination normal.  Mental Status:  Alert, oriented, thought content appropriate. Speech fluent without evidence of aphasia. Able to follow 2 step commands without difficulty.  Cranial  Nerves:  II:  Peripheral visual fields grossly normal, pupils equal, round, reactive to light III,IV, VI: ptosis not present, extra-ocular motions intact bilaterally  V,VII: smile symmetric, facial light touch sensation equal VIII: hearing grossly normal bilaterally  IX,X: midline uvula rise  XI: bilateral shoulder shrug equal and strong XII: midline tongue extension  Motor:  5/5 in upper and lower extremities bilaterally including strong and equal grip strength and dorsiflexion/plantar flexion Sensory: Pinprick and light touch normal in all extremities.  Deep Tendon Reflexes: 2+ and symmetric  Cerebellar: normal finger-to-nose with bilateral upper extremities Gait: normal gait and balance CV: distal pulses palpable throughout   Skin: Skin is warm and dry. No rash noted. Pt is not diaphoretic.  Psychiatric: Pt has a normal mood and affect. Behavior is normal. Judgment and thought content normal.  Nursing note and vitals reviewed. ED Results / Procedures / Treatments   Labs (all labs ordered are listed, but only abnormal results are displayed) Labs Reviewed  CBC WITH DIFFERENTIAL/PLATELET - Abnormal; Notable for the following components:       Result Value   MCV 79.6 (*)    All other components within normal limits  COMPREHENSIVE METABOLIC PANEL - Abnormal; Notable for the following components:   Potassium 3.1 (*)    Glucose, Bld 138 (*)    Creatinine, Ser 1.04 (*)    Calcium 8.8 (*)    AST 66 (*)    ALT 49 (*)    All other components within normal limits  CBG MONITORING, ED - Abnormal; Notable for the following components:   Glucose-Capillary 101 (*)    All other components within normal limits  APTT  PROTIME-INR  URINALYSIS, ROUTINE W REFLEX MICROSCOPIC    EKG None  Radiology CT Head Wo Contrast  Result Date: 11/25/2020 CLINICAL DATA:  Chronic headache with new features or increased frequency. EXAM: CT HEAD WITHOUT CONTRAST TECHNIQUE: Contiguous axial images were obtained from the base of the skull through the vertex without intravenous contrast. COMPARISON:  Brain MRI 04/23/2016 FINDINGS: Brain: No evidence of acute infarction, hemorrhage, hydrocephalus, extra-axial collection or mass lesion/mass effect. Vascular: No hyperdense vessel or unexpected calcification. Skull: Normal. Negative for fracture or focal lesion. Fatty left frontal lipoma. Sinuses/Orbits: Completely opacified left maxillary sinus with sclerotic wall thickening. IMPRESSION: 1. No acute finding. 2. Chronic left maxillary sinus obstruction. Electronically Signed   By: Monte Fantasia M.D.   On: 11/25/2020 05:18    Procedures Procedures (including critical care time)  Medications Ordered in ED Medications  sodium chloride 0.9 % bolus 1,000 mL (1,000 mLs Intravenous New Bag/Given 11/25/20 0759)  ketorolac (TORADOL) 15 MG/ML injection 30 mg (30 mg Intravenous Given 11/25/20 0801)  prochlorperazine (COMPAZINE) injection 10 mg (10 mg Intravenous Given 11/25/20 0802)  diphenhydrAMINE (BENADRYL) injection 25 mg (25 mg Intravenous Given 11/25/20 0801)  potassium chloride SA (KLOR-CON) CR tablet 40 mEq (40 mEq Oral Given 11/25/20 0830)    ED Course  I  have reviewed the triage vital signs and the nursing notes.  Pertinent labs & imaging results that were available during my care of the patient were reviewed by me and considered in my medical decision making (see chart for details).  55 year old presents for evaluation of headache.  Began after having seizure 2-3 days ago.  No recent injury or head trauma.  Located to "all over head."  She has no pain suggestive of temporal arteritis, trigeminal neuralgia.  She has nonfocal neuro exam without deficits.  She has no neck stiffness  or neck rigidity.  She has no meningismus.  Low suspicion for meningitis.  Denies sudden onset thunderclap headache, low suspicion for bleed, SAH.  No blurred vision, unilateral paresthesias, weakness, difficulty with word finding.  Low suspicion for dissection, CVA.  We will plan on migraine cocktail and reassess  Labs and imaging personally reviewed and interpreted:  CBC without leukocytosis Metabolic panel hypokalemia 3.1, glucose 130, creatinine 1.04, mild elevation in AST, ALT, normal alk phos, T bili. CT head without any significant findings.  Patient reassessed.  Headache significantly improved.  She continues have nonfocal neuro exam without deficits.  Symptoms seem consistent with migraine headache which has resolved here in the ED.  Stressed with patient follow-up with neurology.  Will return for any worsening symptoms.  Presentation non concerning for [facility], ICH, Meningitis, or temporal arteritis. Pt is afebrile with no focal neuro deficits, nuchal rigidity, or change in vision.   The patient has been appropriately medically screened and/or stabilized in the ED. I have low suspicion for any other emergent medical condition which would require further screening, evaluation or treatment in the ED or require inpatient management.  Patient is hemodynamically stable and in no acute distress.  Patient able to ambulate in department prior to ED.  Evaluation does not show  acute pathology that would require ongoing or additional emergent interventions while in the emergency department or further inpatient treatment.  I have discussed the diagnosis with the patient and answered all questions.  Pain is been managed while in the emergency department and patient has no further complaints prior to discharge.  Patient is comfortable with plan discussed in room and is stable for discharge at this time.  I have discussed strict return precautions for returning to the emergency department.  Patient was encouraged to follow-up with PCP/specialist refer to at discharge.    MDM Rules/Calculators/A&P                           Final Clinical Impression(s) / ED Diagnoses Final diagnoses:  Acute nonintractable headache, unspecified headache type    Rx / DC Orders ED Discharge Orders    None       Merrisa Skorupski A, PA-C 11/25/20 0920    Dorie Rank, MD 11/25/20 1047

## 2020-11-25 NOTE — ED Triage Notes (Signed)
Patient presents with a headache. Patient states when she has seizures, they trigger migraines. Patient states that she had a seizure 3 days ago and has had a headache since. Patient states hx TIA and stroke with bleed. No blood thinners. Patient endorses blurry vision and nausea.

## 2020-12-02 ENCOUNTER — Other Ambulatory Visit (HOSPITAL_COMMUNITY): Payer: No Typology Code available for payment source

## 2020-12-16 NOTE — Progress Notes (Signed)
Surgical Instructions    Your procedure is scheduled on 12/19/20.  Report to [facility] Main Entrance "A" at 10:30 A.M., then check in with the Admitting office.  Call this number if you have problems the morning of surgery:  [phone]   If you have any questions prior to your surgery date call [phone]: Open Monday-Friday 8am-4pm    Remember:  Do not eat or drink after midnight the night before your surgery     Take these medicines the morning of surgery with A SIP OF WATER  carvedilol (COREG) cyclobenzaprine (FLEXERIL)- if needed gabapentin (NEURONTIN)  levETIRAcetam (KEPPRA) pantoprazole (PROTONIX)  sertraline (ZOLOFT)  As of today, STOP taking any Aspirin (unless otherwise instructed by your surgeon) Aleve, Naproxen, Ibuprofen, Motrin, Advil, Goody's, BC's, all herbal medications, fish oil, and all vitamins.                     Do not wear jewelry, make up, or nail polish            Do not wear lotions, powders, perfumes/colognes, or deodorant.            Do not shave 48 hours prior to surgery.  Men may shave face and neck.            Do not bring valuables to the hospital.            [facility] is not responsible for any belongings or valuables.  Do NOT Smoke (Tobacco/Vaping) or drink Alcohol 24 hours prior to your procedure If you use a CPAP at night, you may bring all equipment for your overnight stay.   Contacts, glasses, dentures or bridgework may not be worn into surgery, please bring cases for these belongings   For patients admitted to the hospital, discharge time will be determined by your treatment team.   Patients discharged the day of surgery will not be allowed to drive home, and someone needs to stay with them for 24 hours.    Special instructions:   [facility]- Preparing For Surgery  Before surgery, you can play an important role. Because skin is not sterile, your skin needs to be as free of germs as possible. You can reduce the number of germs  on your skin by washing with CHG (chlorahexidine gluconate) Soap before surgery.  CHG is an antiseptic cleaner which kills germs and bonds with the skin to continue killing germs even after washing.    Oral Hygiene is also important to reduce your risk of infection.  Remember - BRUSH YOUR TEETH THE MORNING OF SURGERY WITH YOUR REGULAR TOOTHPASTE  Please do not use if you have an allergy to CHG or antibacterial soaps. If your skin becomes reddened/irritated stop using the CHG.  Do not shave (including legs and underarms) for at least 48 hours prior to first CHG shower. It is OK to shave your face.  Please follow these instructions carefully.   1. Shower the NIGHT BEFORE SURGERY and the MORNING OF SURGERY  2. If you chose to wash your hair, wash your hair first as usual with your normal shampoo.  3. After you shampoo, rinse your hair and body thoroughly to remove the shampoo.  4. Wash Face and genitals (private parts) with your normal soap.   5.  Shower the NIGHT BEFORE SURGERY and the MORNING OF SURGERY with CHG Soap.   6. Use CHG Soap as you would any other liquid soap. You can apply CHG directly to the  skin and wash gently with a scrungie or a clean washcloth.   7. Apply the CHG Soap to your body ONLY FROM THE NECK DOWN.  Do not use on open wounds or open sores. Avoid contact with your eyes, ears, mouth and genitals (private parts). Wash Face and genitals (private parts)  with your normal soap.   8. Wash thoroughly, paying special attention to the area where your surgery will be performed.  9. Thoroughly rinse your body with warm water from the neck down.  10. DO NOT shower/wash with your normal soap after using and rinsing off the CHG Soap.  11. Pat yourself dry with a CLEAN TOWEL.  12. Wear CLEAN PAJAMAS to bed the night before surgery  13. Place CLEAN SHEETS on your bed the night before your surgery  14. DO NOT SLEEP WITH PETS.   Day of Surgery: Take a shower.  Wear  Clean/Comfortable clothing the morning of surgery Do not apply any deodorants/lotions.   Remember to brush your teeth WITH YOUR REGULAR TOOTHPASTE.   Please read over the following fact sheets that you were given.

## 2020-12-17 ENCOUNTER — Inpatient Hospital Stay (HOSPITAL_COMMUNITY)
Admission: RE | Admit: 2020-12-17 | Discharge: 2020-12-17 | Disposition: A | Discharge status: Home / Self Care | Payer: No Typology Code available for payment source | Source: Ambulatory Visit

## 2020-12-17 ENCOUNTER — Other Ambulatory Visit (HOSPITAL_COMMUNITY)
Admission: RE | Admit: 2020-12-17 | Discharge: 2020-12-17 | Disposition: A | Discharge status: Home / Self Care | Payer: Medicare Other | Source: Ambulatory Visit | Attending: Neurosurgery | Admitting: Neurosurgery

## 2020-12-17 DIAGNOSIS — K219 Gastro-esophageal reflux disease without esophagitis: Secondary | ICD-10-CM | POA: Diagnosis not present

## 2020-12-17 DIAGNOSIS — Z01812 Encounter for preprocedural laboratory examination: Secondary | ICD-10-CM | POA: Insufficient documentation

## 2020-12-17 DIAGNOSIS — G459 Transient cerebral ischemic attack, unspecified: Secondary | ICD-10-CM | POA: Diagnosis not present

## 2020-12-17 DIAGNOSIS — Z981 Arthrodesis status: Secondary | ICD-10-CM | POA: Diagnosis not present

## 2020-12-17 DIAGNOSIS — Z20822 Contact with and (suspected) exposure to covid-19: Secondary | ICD-10-CM | POA: Insufficient documentation

## 2020-12-17 DIAGNOSIS — Z888 Allergy status to other drugs, medicaments and biological substances status: Secondary | ICD-10-CM | POA: Diagnosis not present

## 2020-12-17 DIAGNOSIS — M4316 Spondylolisthesis, lumbar region: Secondary | ICD-10-CM | POA: Diagnosis not present

## 2020-12-17 DIAGNOSIS — E119 Type 2 diabetes mellitus without complications: Secondary | ICD-10-CM | POA: Diagnosis not present

## 2020-12-17 DIAGNOSIS — Z79899 Other long term (current) drug therapy: Secondary | ICD-10-CM | POA: Diagnosis not present

## 2020-12-17 DIAGNOSIS — G4730 Sleep apnea, unspecified: Secondary | ICD-10-CM | POA: Diagnosis not present

## 2020-12-17 DIAGNOSIS — Z7982 Long term (current) use of aspirin: Secondary | ICD-10-CM | POA: Diagnosis not present

## 2020-12-17 DIAGNOSIS — M4326 Fusion of spine, lumbar region: Secondary | ICD-10-CM | POA: Diagnosis not present

## 2020-12-17 DIAGNOSIS — M48062 Spinal stenosis, lumbar region with neurogenic claudication: Secondary | ICD-10-CM | POA: Diagnosis not present

## 2020-12-17 LAB — SARS CORONAVIRUS 2 (TAT 6-24 HRS): SARS Coronavirus 2: NEGATIVE

## 2020-12-18 ENCOUNTER — Encounter (HOSPITAL_COMMUNITY): Payer: Self-pay | Admitting: Neurosurgery

## 2020-12-18 NOTE — Progress Notes (Signed)
EKG:  01/17/20 CXR:  N/A ECHO:  04/23/16 Stress Test:  Denies Cardiac Cath:  denies  Fasting Blood Sugar- na Checks Blood Sugar__na_ times a day  OSA/CPAP:  Yes, wears nightly  ASA/Blood Thinners:  No  Covid test 12/17/20  Anesthesia Review:  Yes, hx CAD  Patient denies shortness of breath, fever, cough, and chest pain at PAT appointment.  Patient verbalized understanding of instructions provided today at the PAT appointment.  Patient asked to review instructions at home and day of surgery.

## 2020-12-18 NOTE — Progress Notes (Signed)
Anesthesia Chart Review: Same day workup  CAD listed in patient history, however she denied any cardiac history.  This diagnosis appears to have been added to her history in 2017 when she was admitted for TIA versus complex migraine.  Her symptoms resolved in the ED after given IV metoclopramide and diphenhydramine.   CT of the head and MRI of the brain did not show any intracranial abnormalities.  Carotid Doppler showed only mild disease 1 to 39% on the right. TTE  showed normal LVEF, normal wall motion, normal valves.  She has had EKG abnormality with inferior T wave inversions noted since at least 2013.    Per PAT RN, she denied any history of MI, cardiac intervention, or invasive cardiac testing.  She denied any chest pain or shortness of breath.  Her functional status is significantly limited by chronic lumbar spine issues as well as bilateral lower extremity small fiber neuropathy.  OSA not able to tolerate CPAP.  History of seizures, maintained on Keppra.  Followed by neurology at the [facility].  Patient previously prediabetic, however last A1c in epic was normal, 5.3 on 03/20/2020.  Patient will need day of surgery labs and evaluation.  EKG 01/17/2020: Sinus rhythm with Fusion complexes.  Rate 91. T wave abnormality, consider inferior ischemia. No significant change since last tracing.  Inferior T wave abnormalities noted on EKG since at least 2013.  TTE 04/23/2016: - Left ventricle: The cavity size was normal. Systolic function was  normal. The estimated ejection fraction was in the range of 55%  to 60%. Wall motion was normal; there were no regional wall  motion abnormalities.  - Left atrium: The atrium was mildly dilated.  - Atrial septum: No defect or patent foramen ovale was identified.   Carotid duplex 04/23/2016: Summary:  Findings consistent with 1- 39 percent stenosis involving the right  internal carotid artery.    Wynonia Musty [facility] Short Stay  Center/Anesthesiology Phone [phone] 12/18/2020 1:06 PM

## 2020-12-18 NOTE — Anesthesia Preprocedure Evaluation (Addendum)
Anesthesia Evaluation  Patient identified by MRN, date of birth, ID band Patient awake    Reviewed: Allergy & Precautions, NPO status , Patient's Chart, lab work & pertinent test results, reviewed documented beta blocker date and time   History of Anesthesia Complications Negative for: history of anesthetic complications  Airway Mallampati: III  TM Distance: >3 FB Neck ROM: Full    Dental  (+) Dental Advisory Given, Teeth Intact   Pulmonary asthma , sleep apnea and Continuous Positive Airway Pressure Ventilation ,    Pulmonary exam normal        Cardiovascular negative cardio ROS Normal cardiovascular exam   '17 TTE - EF of 55% to 60%. LA was mildly dilated. Mild TR, trivial PR.    Neuro/Psych Seizures -, Well Controlled,  TIAnegative psych ROS   GI/Hepatic Neg liver ROS, GERD  Medicated and Controlled,  Endo/Other  diabetesMorbid obesity  Renal/GU negative Renal ROS     Musculoskeletal negative musculoskeletal ROS (+)   Abdominal (+) + obese,   Peds  Hematology negative hematology ROS (+)   Anesthesia Other Findings Covid test negative See PAT note  Reproductive/Obstetrics                           Anesthesia Physical Anesthesia Plan  ASA: III  Anesthesia Plan: General   Post-op Pain Management:    Induction: Intravenous  PONV Risk Score and Plan: 3 and Treatment may vary due to age or medical condition, Ondansetron, Dexamethasone and Midazolam  Airway Management Planned: Oral ETT  Additional Equipment: None  Intra-op Plan:   Post-operative Plan: Extubation in OR  Informed Consent: I have reviewed the patients History and Physical, chart, labs and discussed the procedure including the risks, benefits and alternatives for the proposed anesthesia with the patient or authorized representative who has indicated his/her understanding and acceptance.     Dental advisory  given  Plan Discussed with: CRNA and Anesthesiologist  Anesthesia Plan Comments: ( )      Anesthesia Quick Evaluation

## 2020-12-19 ENCOUNTER — Other Ambulatory Visit: Payer: Self-pay

## 2020-12-19 ENCOUNTER — Inpatient Hospital Stay (HOSPITAL_COMMUNITY): Payer: Medicare Other | Admitting: Physician Assistant

## 2020-12-19 ENCOUNTER — Inpatient Hospital Stay (HOSPITAL_COMMUNITY): Payer: Medicare Other

## 2020-12-19 ENCOUNTER — Encounter (HOSPITAL_COMMUNITY)
Admission: RE | Disposition: A | Discharge status: Home / Self Care | Payer: Self-pay | Source: Home / Self Care | Attending: Neurosurgery

## 2020-12-19 ENCOUNTER — Inpatient Hospital Stay (HOSPITAL_COMMUNITY)
Admission: RE | Admit: 2020-12-19 | Discharge: 2020-12-21 | DRG: 455 | Disposition: A | Discharge status: Home / Self Care | Payer: Medicare Other | Attending: Neurosurgery | Admitting: Neurosurgery

## 2020-12-19 ENCOUNTER — Inpatient Hospital Stay (HOSPITAL_COMMUNITY): Payer: Medicare Other | Admitting: Certified Registered Nurse Anesthetist

## 2020-12-19 ENCOUNTER — Encounter (HOSPITAL_COMMUNITY): Payer: Self-pay | Admitting: Neurosurgery

## 2020-12-19 DIAGNOSIS — Z20822 Contact with and (suspected) exposure to covid-19: Secondary | ICD-10-CM | POA: Diagnosis present

## 2020-12-19 DIAGNOSIS — M4800 Spinal stenosis, site unspecified: Secondary | ICD-10-CM | POA: Diagnosis present

## 2020-12-19 DIAGNOSIS — Z79899 Other long term (current) drug therapy: Secondary | ICD-10-CM | POA: Diagnosis not present

## 2020-12-19 DIAGNOSIS — Z419 Encounter for procedure for purposes other than remedying health state, unspecified: Secondary | ICD-10-CM

## 2020-12-19 DIAGNOSIS — Z981 Arthrodesis status: Secondary | ICD-10-CM | POA: Diagnosis not present

## 2020-12-19 DIAGNOSIS — Z7982 Long term (current) use of aspirin: Secondary | ICD-10-CM

## 2020-12-19 DIAGNOSIS — M48062 Spinal stenosis, lumbar region with neurogenic claudication: Secondary | ICD-10-CM | POA: Diagnosis present

## 2020-12-19 DIAGNOSIS — Z888 Allergy status to other drugs, medicaments and biological substances status: Secondary | ICD-10-CM

## 2020-12-19 LAB — BASIC METABOLIC PANEL
Anion gap: 10 (ref 5–15)
BUN: 7 mg/dL (ref 6–20)
CO2: 23 mmol/L (ref 22–32)
Calcium: 9.3 mg/dL (ref 8.9–10.3)
Chloride: 107 mmol/L (ref 98–111)
Creatinine, Ser: 0.9 mg/dL (ref 0.44–1.00)
GFR, Estimated: >60 mL/min (ref >60)
Glucose, Bld: 94 mg/dL (ref 70–99)
Potassium: 3.5 mmol/L (ref 3.5–5.1)
Sodium: 140 mmol/L (ref 135–145)

## 2020-12-19 LAB — GLUCOSE, CAPILLARY
Glucose-Capillary: 110 mg/dL — ABNORMAL HIGH (ref 70–99)
Glucose-Capillary: 173 mg/dL — ABNORMAL HIGH (ref 70–99)

## 2020-12-19 LAB — CBC
HCT: 42 % (ref 36.0–46.0)
Hemoglobin: 13.7 g/dL (ref 12.0–15.0)
MCH: 26 pg (ref 26.0–34.0)
MCHC: 32.6 g/dL (ref 30.0–36.0)
MCV: 79.8 fL — ABNORMAL LOW (ref 80.0–100.0)
Platelets: 334 10*3/uL (ref 150–400)
RBC: 5.26 MIL/uL — ABNORMAL HIGH (ref 3.87–5.11)
RDW: 14.7 % (ref 11.5–15.5)
WBC: 5.6 10*3/uL (ref 4.0–10.5)
nRBC: 0 % (ref 0.0–0.2)

## 2020-12-19 LAB — ABO/RH: ABO/RH(D): O POS

## 2020-12-19 LAB — SURGICAL PCR SCREEN
MRSA, PCR: NEGATIVE
Staphylococcus aureus: POSITIVE — AB

## 2020-12-19 LAB — TYPE AND SCREEN
ABO/RH(D): O POS
Antibody Screen: NEGATIVE

## 2020-12-19 SURGERY — POSTERIOR LUMBAR FUSION 1 LEVEL
Anesthesia: General | Site: Spine Lumbar

## 2020-12-19 MED ORDER — LIDOCAINE 2% (20 MG/ML) 5 ML SYRINGE
INTRAMUSCULAR | Status: AC
  Filled 2020-12-19: qty 10

## 2020-12-19 MED ORDER — PROMETHAZINE HCL 25 MG/ML IJ SOLN: 6.2500 mg | INTRAMUSCULAR | Status: DC | PRN

## 2020-12-19 MED ORDER — ONDANSETRON HCL 4 MG/2ML IJ SOLN: 4.0000 mg | Freq: Four times a day (QID) | INTRAMUSCULAR | Status: DC | PRN

## 2020-12-19 MED ORDER — POTASSIUM CHLORIDE IN NACL 20-0.9 MEQ/L-% IV SOLN: INTRAVENOUS | Status: DC

## 2020-12-19 MED ORDER — OXYCODONE HCL 5 MG PO TABS
10.0000 mg | ORAL_TABLET | ORAL | Status: DC | PRN
  Administered 2020-12-19 – 2020-12-21 (×6): 10 mg via ORAL
  Filled 2020-12-19 (×6): qty 2

## 2020-12-19 MED ORDER — EPHEDRINE 5 MG/ML INJ
INTRAVENOUS | Status: AC
  Filled 2020-12-19: qty 10

## 2020-12-19 MED ORDER — BISACODYL 5 MG PO TBEC: 5.0000 mg | DELAYED_RELEASE_TABLET | Freq: Every day | ORAL | Status: DC | PRN

## 2020-12-19 MED ORDER — OXYCODONE HCL ER 15 MG PO T12A
15.0000 mg | EXTENDED_RELEASE_TABLET | Freq: Two times a day (BID) | ORAL | Status: DC
Start: 2020-12-19 — End: 2020-12-21
  Administered 2020-12-19 – 2020-12-20 (×3): 15 mg via ORAL
  Filled 2020-12-19 (×3): qty 1

## 2020-12-19 MED ORDER — ONDANSETRON HCL 4 MG/2ML IJ SOLN
INTRAMUSCULAR | Status: DC | PRN
  Administered 2020-12-19: 4 mg via INTRAVENOUS

## 2020-12-19 MED ORDER — BUPIVACAINE HCL (PF) 0.5 % IJ SOLN
INTRAMUSCULAR | Status: AC
  Filled 2020-12-19: qty 30

## 2020-12-19 MED ORDER — ATORVASTATIN CALCIUM 10 MG PO TABS
20.0000 mg | ORAL_TABLET | Freq: Every day | ORAL | Status: DC
  Administered 2020-12-20: 20 mg via ORAL
  Filled 2020-12-19: qty 2

## 2020-12-19 MED ORDER — LACTATED RINGERS IV SOLN: INTRAVENOUS | Status: DC

## 2020-12-19 MED ORDER — ROCURONIUM BROMIDE 10 MG/ML (PF) SYRINGE
PREFILLED_SYRINGE | INTRAVENOUS | Status: DC | PRN
  Administered 2020-12-19: 60 mg via INTRAVENOUS

## 2020-12-19 MED ORDER — ONDANSETRON HCL 4 MG PO TABS: 4.0000 mg | ORAL_TABLET | Freq: Four times a day (QID) | ORAL | Status: DC | PRN

## 2020-12-19 MED ORDER — BUPIVACAINE HCL (PF) 0.5 % IJ SOLN
INTRAMUSCULAR | Status: DC | PRN
  Administered 2020-12-19: 30 mL

## 2020-12-19 MED ORDER — LIDOCAINE-EPINEPHRINE 0.5 %-1:200000 IJ SOLN
INTRAMUSCULAR | Status: AC
  Filled 2020-12-19: qty 1

## 2020-12-19 MED ORDER — EPHEDRINE SULFATE-NACL 50-0.9 MG/10ML-% IV SOSY
PREFILLED_SYRINGE | INTRAVENOUS | Status: DC | PRN
  Administered 2020-12-19: 30 mg via INTRAVENOUS

## 2020-12-19 MED ORDER — DEXAMETHASONE SODIUM PHOSPHATE 10 MG/ML IJ SOLN
INTRAMUSCULAR | Status: AC
  Filled 2020-12-19: qty 1

## 2020-12-19 MED ORDER — CARVEDILOL 12.5 MG PO TABS
12.5000 mg | ORAL_TABLET | Freq: Once | ORAL | Status: AC
  Administered 2020-12-19: 12.5 mg via ORAL
  Filled 2020-12-19: qty 1

## 2020-12-19 MED ORDER — SENNOSIDES-DOCUSATE SODIUM 8.6-50 MG PO TABS: 1.0000 | ORAL_TABLET | Freq: Every evening | ORAL | Status: DC | PRN

## 2020-12-19 MED ORDER — ACETAMINOPHEN 325 MG PO TABS
650.0000 mg | ORAL_TABLET | ORAL | Status: DC | PRN
  Administered 2020-12-20 – 2020-12-21 (×2): 650 mg via ORAL
  Filled 2020-12-19 (×3): qty 2

## 2020-12-19 MED ORDER — FENTANYL CITRATE (PF) 100 MCG/2ML IJ SOLN
25.0000 ug | INTRAMUSCULAR | Status: DC | PRN
  Administered 2020-12-19: 50 ug via INTRAVENOUS

## 2020-12-19 MED ORDER — PROPOFOL 10 MG/ML IV BOLUS
INTRAVENOUS | Status: DC | PRN
  Administered 2020-12-19: 50 mg via INTRAVENOUS
  Administered 2020-12-19: 150 mg via INTRAVENOUS

## 2020-12-19 MED ORDER — ACETAMINOPHEN 650 MG RE SUPP: 650.0000 mg | RECTAL | Status: DC | PRN

## 2020-12-19 MED ORDER — LIDOCAINE-EPINEPHRINE 0.5 %-1:200000 IJ SOLN
INTRAMUSCULAR | Status: DC | PRN
  Administered 2020-12-19: 10 mL

## 2020-12-19 MED ORDER — 0.9 % SODIUM CHLORIDE (POUR BTL) OPTIME
TOPICAL | Status: DC | PRN
  Administered 2020-12-19: 1000 mL

## 2020-12-19 MED ORDER — LEVETIRACETAM 250 MG PO TABS
500.0000 mg | ORAL_TABLET | Freq: Three times a day (TID) | ORAL | Status: DC
  Administered 2020-12-19 – 2020-12-21 (×5): 500 mg via ORAL
  Filled 2020-12-19 (×5): qty 2

## 2020-12-19 MED ORDER — THROMBIN 20000 UNITS EX SOLR
CUTANEOUS | Status: DC | PRN
  Administered 2020-12-19: 20 mL via TOPICAL

## 2020-12-19 MED ORDER — FENTANYL CITRATE (PF) 250 MCG/5ML IJ SOLN
INTRAMUSCULAR | Status: DC | PRN
  Administered 2020-12-19: 100 ug via INTRAVENOUS
  Administered 2020-12-19: 50 ug via INTRAVENOUS

## 2020-12-19 MED ORDER — CARVEDILOL 12.5 MG PO TABS
ORAL_TABLET | ORAL | Status: AC
  Filled 2020-12-19: qty 1

## 2020-12-19 MED ORDER — INSULIN ASPART 100 UNIT/ML ~~LOC~~ SOLN: 0.0000 [IU] | SUBCUTANEOUS | Status: DC

## 2020-12-19 MED ORDER — ORAL CARE MOUTH RINSE: 15.0000 mL | Freq: Once | OROMUCOSAL | Status: AC

## 2020-12-19 MED ORDER — OXYCODONE HCL 5 MG PO TABS: 5.0000 mg | ORAL_TABLET | Freq: Once | ORAL | Status: DC | PRN

## 2020-12-19 MED ORDER — PHENYLEPHRINE HCL-NACL 10-0.9 MG/250ML-% IV SOLN
INTRAVENOUS | Status: DC | PRN
  Administered 2020-12-19: 40 ug/min via INTRAVENOUS

## 2020-12-19 MED ORDER — FENTANYL CITRATE (PF) 250 MCG/5ML IJ SOLN
INTRAMUSCULAR | Status: AC
  Filled 2020-12-19: qty 5

## 2020-12-19 MED ORDER — CARVEDILOL 12.5 MG PO TABS
12.5000 mg | ORAL_TABLET | Freq: Two times a day (BID) | ORAL | Status: DC
  Administered 2020-12-20 (×2): 12.5 mg via ORAL
  Filled 2020-12-19 (×2): qty 1

## 2020-12-19 MED ORDER — DIAZEPAM 5 MG PO TABS
5.0000 mg | ORAL_TABLET | Freq: Four times a day (QID) | ORAL | Status: DC | PRN
Start: 2020-12-19 — End: 2020-12-21
  Administered 2020-12-19: 5 mg via ORAL
  Filled 2020-12-19: qty 1

## 2020-12-19 MED ORDER — DEXAMETHASONE SODIUM PHOSPHATE 10 MG/ML IJ SOLN
INTRAMUSCULAR | Status: DC | PRN
  Administered 2020-12-19: 4 mg via INTRAVENOUS

## 2020-12-19 MED ORDER — CYCLOBENZAPRINE HCL 10 MG PO TABS: 10.0000 mg | ORAL_TABLET | Freq: Two times a day (BID) | ORAL | Status: DC | PRN

## 2020-12-19 MED ORDER — ASPIRIN EC 81 MG PO TBEC
81.0000 mg | DELAYED_RELEASE_TABLET | ORAL | Status: DC
  Administered 2020-12-19 – 2020-12-21 (×3): 81 mg via ORAL
  Filled 2020-12-19 (×3): qty 1

## 2020-12-19 MED ORDER — PROPOFOL 10 MG/ML IV BOLUS
INTRAVENOUS | Status: AC
  Filled 2020-12-19: qty 20

## 2020-12-19 MED ORDER — PHENYLEPHRINE 40 MCG/ML (10ML) SYRINGE FOR IV PUSH (FOR BLOOD PRESSURE SUPPORT)
PREFILLED_SYRINGE | INTRAVENOUS | Status: AC
  Filled 2020-12-19: qty 10

## 2020-12-19 MED ORDER — CHLORHEXIDINE GLUCONATE 0.12 % MT SOLN
15.0000 mL | Freq: Once | OROMUCOSAL | Status: AC
  Administered 2020-12-19: 15 mL via OROMUCOSAL
  Filled 2020-12-19: qty 15

## 2020-12-19 MED ORDER — SENNA 8.6 MG PO TABS
1.0000 | ORAL_TABLET | Freq: Two times a day (BID) | ORAL | Status: DC
  Administered 2020-12-20 (×2): 8.6 mg via ORAL
  Filled 2020-12-19 (×3): qty 1

## 2020-12-19 MED ORDER — KETOROLAC TROMETHAMINE 15 MG/ML IJ SOLN
15.0000 mg | Freq: Four times a day (QID) | INTRAMUSCULAR | Status: AC
  Administered 2020-12-19 – 2020-12-20 (×4): 15 mg via INTRAVENOUS
  Filled 2020-12-19 (×2): qty 1

## 2020-12-19 MED ORDER — MAGNESIUM CITRATE PO SOLN: 1.0000 | Freq: Once | ORAL | Status: DC | PRN

## 2020-12-19 MED ORDER — PHENYLEPHRINE 40 MCG/ML (10ML) SYRINGE FOR IV PUSH (FOR BLOOD PRESSURE SUPPORT)
PREFILLED_SYRINGE | INTRAVENOUS | Status: DC | PRN
  Administered 2020-12-19: 160 ug via INTRAVENOUS
  Administered 2020-12-19 (×2): 200 ug via INTRAVENOUS

## 2020-12-19 MED ORDER — ONDANSETRON HCL 4 MG/2ML IJ SOLN
INTRAMUSCULAR | Status: AC
  Filled 2020-12-19: qty 2

## 2020-12-19 MED ORDER — FENTANYL CITRATE (PF) 100 MCG/2ML IJ SOLN
INTRAMUSCULAR | Status: AC
  Filled 2020-12-19: qty 2

## 2020-12-19 MED ORDER — SERTRALINE HCL 50 MG PO TABS
200.0000 mg | ORAL_TABLET | Freq: Every day | ORAL | Status: DC
  Administered 2020-12-19 – 2020-12-21 (×3): 200 mg via ORAL
  Filled 2020-12-19 (×3): qty 4

## 2020-12-19 MED ORDER — LIDOCAINE 2% (20 MG/ML) 5 ML SYRINGE
INTRAMUSCULAR | Status: DC | PRN
  Administered 2020-12-19: 100 mg via INTRAVENOUS

## 2020-12-19 MED ORDER — KETOROLAC TROMETHAMINE 15 MG/ML IJ SOLN
INTRAMUSCULAR | Status: AC
  Filled 2020-12-19: qty 1

## 2020-12-19 MED ORDER — TRAZODONE HCL 50 MG PO TABS
50.0000 mg | ORAL_TABLET | Freq: Every day | ORAL | Status: DC
  Filled 2020-12-19 (×3): qty 2

## 2020-12-19 MED ORDER — PANTOPRAZOLE SODIUM 40 MG PO TBEC
40.0000 mg | DELAYED_RELEASE_TABLET | Freq: Every day | ORAL | Status: DC
  Administered 2020-12-19 – 2020-12-21 (×3): 40 mg via ORAL
  Filled 2020-12-19 (×3): qty 1

## 2020-12-19 MED ORDER — GABAPENTIN 300 MG PO CAPS
300.0000 mg | ORAL_CAPSULE | Freq: Every day | ORAL | Status: DC
  Administered 2020-12-19 – 2020-12-20 (×2): 300 mg via ORAL
  Filled 2020-12-19 (×2): qty 1

## 2020-12-19 MED ORDER — CEFAZOLIN SODIUM-DEXTROSE 2-4 GM/100ML-% IV SOLN
INTRAVENOUS | Status: AC
  Filled 2020-12-19: qty 100

## 2020-12-19 MED ORDER — MENTHOL 3 MG MT LOZG: 1.0000 | LOZENGE | OROMUCOSAL | Status: DC | PRN

## 2020-12-19 MED ORDER — PHENOL 1.4 % MT LIQD: 1.0000 | OROMUCOSAL | Status: DC | PRN

## 2020-12-19 MED ORDER — OXYCODONE HCL 5 MG PO TABS
5.0000 mg | ORAL_TABLET | ORAL | Status: DC | PRN
  Administered 2020-12-20: 5 mg via ORAL
  Filled 2020-12-19: qty 1

## 2020-12-19 MED ORDER — SODIUM CHLORIDE 0.9% FLUSH: 3.0000 mL | INTRAVENOUS | Status: DC | PRN

## 2020-12-19 MED ORDER — MIDAZOLAM HCL 2 MG/2ML IJ SOLN
INTRAMUSCULAR | Status: DC | PRN
  Administered 2020-12-19: 2 mg via INTRAVENOUS

## 2020-12-19 MED ORDER — OXYCODONE HCL 5 MG/5ML PO SOLN: 5.0000 mg | Freq: Once | ORAL | Status: DC | PRN

## 2020-12-19 MED ORDER — CEFAZOLIN SODIUM-DEXTROSE 2-3 GM-%(50ML) IV SOLR
INTRAVENOUS | Status: DC | PRN
  Administered 2020-12-19: 2 g via INTRAVENOUS

## 2020-12-19 MED ORDER — SODIUM CHLORIDE 0.9% FLUSH
3.0000 mL | Freq: Two times a day (BID) | INTRAVENOUS | Status: DC
  Administered 2020-12-19 – 2020-12-20 (×3): 3 mL via INTRAVENOUS

## 2020-12-19 MED ORDER — MONTELUKAST SODIUM 10 MG PO TABS
10.0000 mg | ORAL_TABLET | Freq: Every day | ORAL | Status: DC
  Administered 2020-12-19 – 2020-12-20 (×2): 10 mg via ORAL
  Filled 2020-12-19 (×2): qty 1

## 2020-12-19 MED ORDER — ROCURONIUM BROMIDE 10 MG/ML (PF) SYRINGE
PREFILLED_SYRINGE | INTRAVENOUS | Status: AC
  Filled 2020-12-19: qty 30

## 2020-12-19 MED ORDER — THROMBIN 20000 UNITS EX SOLR
CUTANEOUS | Status: AC
  Filled 2020-12-19: qty 20000

## 2020-12-19 MED ORDER — MORPHINE SULFATE (PF) 2 MG/ML IV SOLN: 2.0000 mg | INTRAVENOUS | Status: DC | PRN

## 2020-12-19 MED ORDER — INSULIN ASPART 100 UNIT/ML ~~LOC~~ SOLN
0.0000 [IU] | Freq: Three times a day (TID) | SUBCUTANEOUS | Status: DC
  Administered 2020-12-20 (×2): 3 [IU] via SUBCUTANEOUS

## 2020-12-19 MED ORDER — MIDAZOLAM HCL 2 MG/2ML IJ SOLN
INTRAMUSCULAR | Status: AC
  Filled 2020-12-19: qty 2

## 2020-12-19 MED ORDER — SODIUM CHLORIDE 0.9 % IV SOLN: 250.0000 mL | INTRAVENOUS | Status: DC

## 2020-12-19 SURGICAL SUPPLY — 65 items
BASKET BONE COLLECTION (BASKET) ×2 IMPLANT
BENZOIN TINCTURE PRP APPL 2/3 (GAUZE/BANDAGES/DRESSINGS) IMPLANT
BLADE CLIPPER SURG (BLADE) IMPLANT
BUR MATCHSTICK NEURO 3.0 LAGG (BURR) ×2 IMPLANT
BUR PRECISION FLUTE 5.0 (BURR) ×2 IMPLANT
CANISTER SUCT 3000ML PPV (MISCELLANEOUS) ×2 IMPLANT
CARTRIDGE OIL MAESTRO DRILL (MISCELLANEOUS) ×1 IMPLANT
CNTNR URN SCR LID CUP LEK RST (MISCELLANEOUS) ×1 IMPLANT
CONT SPEC 4OZ STRL OR WHT (MISCELLANEOUS) ×2
COVER BACK TABLE 60X90IN (DRAPES) ×2 IMPLANT
COVER WAND RF STERILE (DRAPES) IMPLANT
DECANTER SPIKE VIAL GLASS SM (MISCELLANEOUS) ×2 IMPLANT
DERMABOND ADVANCED (GAUZE/BANDAGES/DRESSINGS) ×1
DERMABOND ADVANCED .7 DNX12 (GAUZE/BANDAGES/DRESSINGS) ×1 IMPLANT
DIFFUSER DRILL AIR PNEUMATIC (MISCELLANEOUS) ×2 IMPLANT
DRAPE C-ARM 42X72 X-RAY (DRAPES) ×2 IMPLANT
DRAPE C-ARMOR (DRAPES) ×2 IMPLANT
DRAPE LAPAROTOMY 100X72X124 (DRAPES) ×2 IMPLANT
DRAPE SURG 17X23 STRL (DRAPES) ×2 IMPLANT
DURAPREP 26ML APPLICATOR (WOUND CARE) ×2 IMPLANT
ELECT BLADE 4.0 EZ CLEAN MEGAD (MISCELLANEOUS) ×2
ELECT REM PT RETURN 9FT ADLT (ELECTROSURGICAL) ×2
ELECTRODE BLDE 4.0 EZ CLN MEGD (MISCELLANEOUS) ×1 IMPLANT
ELECTRODE REM PT RTRN 9FT ADLT (ELECTROSURGICAL) ×1 IMPLANT
GAUZE 4X4 16PLY RFD (DISPOSABLE) ×2 IMPLANT
GAUZE SPONGE 4X4 12PLY STRL (GAUZE/BANDAGES/DRESSINGS) IMPLANT
GLOVE ECLIPSE 8.0 STRL XLNG CF (GLOVE) ×4 IMPLANT
GLOVE EXAM NITRILE XL STR (GLOVE) IMPLANT
GLOVE SRG 8 PF TXTR STRL LF DI (GLOVE) ×3 IMPLANT
GLOVE SURG LTX SZ6.5 (GLOVE) ×2 IMPLANT
GLOVE SURG LTX SZ7.5 (GLOVE) ×2 IMPLANT
GLOVE SURG UNDER POLY LF SZ7 (GLOVE) ×2 IMPLANT
GLOVE SURG UNDER POLY LF SZ8 (GLOVE) ×6
GOWN STRL REUS W/ TWL LRG LVL3 (GOWN DISPOSABLE) ×4 IMPLANT
GOWN STRL REUS W/ TWL XL LVL3 (GOWN DISPOSABLE) ×3 IMPLANT
GOWN STRL REUS W/TWL 2XL LVL3 (GOWN DISPOSABLE) ×2 IMPLANT
GOWN STRL REUS W/TWL LRG LVL3 (GOWN DISPOSABLE) ×8
GOWN STRL REUS W/TWL XL LVL3 (GOWN DISPOSABLE) ×6
KIT BASIN OR (CUSTOM PROCEDURE TRAY) ×2 IMPLANT
KIT POSITION SURG JACKSON T1 (MISCELLANEOUS) ×2 IMPLANT
KIT TURNOVER KIT B (KITS) ×2 IMPLANT
MARKER SKIN DUAL TIP RULER LAB (MISCELLANEOUS) ×2 IMPLANT
MILL MEDIUM DISP (BLADE) ×2 IMPLANT
NEEDLE HYPO 25X1 1.5 SAFETY (NEEDLE) ×2 IMPLANT
NEEDLE SPNL 18GX3.5 QUINCKE PK (NEEDLE) ×2 IMPLANT
NS IRRIG 1000ML POUR BTL (IV SOLUTION) ×2 IMPLANT
OIL CARTRIDGE MAESTRO DRILL (MISCELLANEOUS) ×2
PACK LAMINECTOMY NEURO (CUSTOM PROCEDURE TRAY) ×2 IMPLANT
PAD ARMBOARD 7.5X6 YLW CONV (MISCELLANEOUS) ×6 IMPLANT
ROD 40MM SOLERA PREBENT (Rod) ×4 IMPLANT
SCREW SET SOLERA (Screw) ×8 IMPLANT
SCREW SET SOLERA TI5.5 (Screw) ×4 IMPLANT
SCREW SOLERA 6.5X35MM (Screw) ×4 IMPLANT
SPONGE LAP 4X18 RFD (DISPOSABLE) IMPLANT
SPONGE SURGIFOAM ABS GEL 100 (HEMOSTASIS) ×2 IMPLANT
STRIP CLOSURE SKIN 1/2X4 (GAUZE/BANDAGES/DRESSINGS) IMPLANT
SUT PROLENE 6 0 BV (SUTURE) IMPLANT
SUT VIC AB 0 CT1 18XCR BRD8 (SUTURE) ×2 IMPLANT
SUT VIC AB 0 CT1 8-18 (SUTURE) ×4
SUT VIC AB 2-0 CT1 18 (SUTURE) ×4 IMPLANT
SUT VIC AB 3-0 SH 8-18 (SUTURE) ×4 IMPLANT
TOWEL GREEN STERILE (TOWEL DISPOSABLE) ×2 IMPLANT
TOWEL GREEN STERILE FF (TOWEL DISPOSABLE) ×2 IMPLANT
TRAY FOLEY MTR SLVR 16FR STAT (SET/KITS/TRAYS/PACK) ×2 IMPLANT
WATER STERILE IRR 1000ML POUR (IV SOLUTION) ×2 IMPLANT

## 2020-12-19 NOTE — Hospital Course (Signed)
BP 134/78   Pulse 83   Temp 97.7 F (36.5 C) (Oral)   Resp 18   Ht 5\' 4"  (1.626 m)   Wt 115.7 kg   SpO2 96%   BMI 43.77 kg/m

## 2020-12-19 NOTE — Anesthesia Postprocedure Evaluation (Signed)
Anesthesia Post Note  Patient: Holly Cook  Procedure(s) Performed: LUMBAR THREE-FOUR DECOMPRESSON WITH PEDICLE SCREW FIXATION FROM LUMBAR THREE-FIVE. (N/A Spine Lumbar)     Patient location during evaluation: PACU Anesthesia Type: General Level of consciousness: awake and alert Pain management: pain level controlled Vital Signs Assessment: post-procedure vital signs reviewed and stable Respiratory status: spontaneous breathing, nonlabored ventilation, respiratory function stable and patient connected to nasal cannula oxygen Cardiovascular status: blood pressure returned to baseline and stable Postop Assessment: no apparent nausea or vomiting Anesthetic complications: no   No complications documented.  Last Vitals:  Vitals:   12/19/20 1720 12/19/20 1750  BP: 120/89 124/66  Pulse: 73 71  Resp: 18 10  Temp: (!) 36.2 C   SpO2: 95% 100%    Last Pain:  Vitals:   12/19/20 1750  TempSrc:   PainSc: 8                  [name] S

## 2020-12-19 NOTE — Transfer of Care (Signed)
Immediate Anesthesia Transfer of Care Note  Patient: Holly Cook  Procedure(s) Performed: LUMBAR THREE-FOUR DECOMPRESSON WITH PEDICLE SCREW FIXATION FROM LUMBAR THREE-FIVE. (N/A Spine Lumbar)  Patient Location: PACU  Anesthesia Type:General  Level of Consciousness: drowsy and patient cooperative  Airway & Oxygen Therapy: Patient Spontanous Breathing and Patient connected to nasal cannula oxygen  Post-op Assessment: Report given to RN, Post -op Vital signs reviewed and stable and Patient moving all extremities  Post vital signs: Reviewed and stable  Last Vitals:  Vitals Value Taken Time  BP 120/89 12/19/20 1721  Temp    Pulse 80 12/19/20 1723  Resp 19 12/19/20 1723  SpO2 96 % 12/19/20 1723  Vitals shown include unvalidated device data.  Last Pain:  Vitals:   12/19/20 1029  TempSrc: Oral         Complications: No complications documented.

## 2020-12-19 NOTE — Progress Notes (Signed)
Pt wears cpap at home. Pt is currently using hospital cpap QHS.

## 2020-12-19 NOTE — Op Note (Signed)
12/19/2020  6:45 PM  PATIENT:  Holly Cook  55 y.o. female presents with neurogenic claudication at L3/4 adjacent to her previous arthrodesis at L4/5. I recommended a decompression and posterolateral arthrodesis, as her disc looks good.   PRE-OPERATIVE DIAGNOSIS:  Neurogenic claudication due to lumbar spinal stenosis L3/4  POST-OPERATIVE DIAGNOSIS:  Neurogenic claudication due to lumbar spinal stenosis L3/4  PROCEDURE:  Procedure(s): LUMBAR THREE-FOUR laminectomy for DECOMPRESSON. Posterolateral arthrodesis with autograft morsels L3/4 Non segmental pedicle screw fixation L3/4(medtronic hardware)  SURGEON:  Surgeon(s): Ashok Pall, MD Dawley, Theodoro Doing, DO  ASSISTANTS:Dawley, Pieter Partridge  ANESTHESIA:   general  EBL:  Total I/O In: 1700 [I.V.:1700] Out: 365 [Urine:265; Blood:100]  BLOOD ADMINISTERED:none  CELL SAVER GIVEN:none  COUNT:per nursing  DRAINS: none   SPECIMEN:  No Specimen  DICTATION: Holly Cook is a 55 y.o. female whom was taken to the operating room intubated, and placed under a general anesthetic without difficulty. A foley catheter was placed under sterile conditions. She was positioned prone on a Jackson table with all pressure points properly padded.  Her lumbar region was prepped and draped in a sterile manner. I infiltrated 10cc's 1/2%lidocaine/1:2000,000 strength epinephrine into the planned incision. I opened the skin with a 10 blade and took the incision down to the thoracolumbar fascia. I exposed the lamina of L3 in a subperiosteal fashion bilaterally. I confirmed my location with an intraoperative xray.  I placed self retaining retractors and started the decompression.  I decompressed the spinal canal via a near complete L3 laminectomy. I used the drill and Kerrison punches to remove hypertrophied ligamentum flavum, and deal with the facet arthropathy.  I decorticated the lateral bone at L3 and 4. I then placed autograft morsels on the decorticated surfaces to  complete the posterolateral arthrodesis.  I placed pedicle screws at L3, using fluoroscopic guidance. I drilled a pilot hole, then cannulated the pedicle with a tap at each site. No cutouts were appreciated. Screws (medtronic) were then placed at each site without difficulty. I exposed the existing hardware at L4, and L5. I removed the locking caps, and the rods. The L5 screws were fairly encased in bone so I left them in place. There was a solid arthrodesis between L4 and L5. We placed the new rods from the L3 screws to the L4 screws. We attached rods and locking caps with the appropriate tools. The locking caps were secured with torque limited screwdrivers. Final films were performed and the final construct appeared to be in good position.  We closed the wound in a layered fashion. We approximated the thoracolumbar fascia, subcutaneous, and subcuticular planes with vicryl sutures. I used dermabond for a sterile dressing.     PLAN OF CARE: Admit to inpatient   PATIENT DISPOSITION:  PACU - hemodynamically stable.   Delay start of Pharmacological VTE agent (>24hrs) due to surgical blood loss or risk of bleeding:  yes

## 2020-12-19 NOTE — Anesthesia Procedure Notes (Signed)
Procedure Name: Intubation Date/Time: 12/19/2020 1:52 PM Performed by: Leonor Liv, CRNA Pre-anesthesia Checklist: Patient identified, Emergency Drugs available, Suction available and Patient being monitored Patient Re-evaluated:Patient Re-evaluated prior to induction Oxygen Delivery Method: Circle System Utilized Preoxygenation: Pre-oxygenation with 100% oxygen Induction Type: IV induction Ventilation: Mask ventilation without difficulty and Oral airway inserted - appropriate to patient size Laryngoscope Size: Mac and 3 Grade View: Grade I Tube type: Oral Tube size: 7.0 mm Number of attempts: 1 Airway Equipment and Method: Stylet and Oral airway Placement Confirmation: ETT inserted through vocal cords under direct vision,  positive ETCO2 and breath sounds checked- equal and bilateral Secured at: 21 cm Tube secured with: Tape Dental Injury: Teeth and Oropharynx as per pre-operative assessment

## 2020-12-19 NOTE — H&P (Signed)
BP 134/78   Pulse 83   Temp 97.7 F (36.5 C) (Oral)   Resp 18   Ht 5\' 4"  (1.626 m)   Wt 115.7 kg   SpO2 96%   BMI 43.77 kg/m     This is Holly Cook initial visit.  Holly Cook comes in today for evaluation of pain that she has in the lower extremities, but most significantly pain that she has in both feet.  She has an IT trainer wheelchair and states that while she can stand and walk, it hurts her to stand or walk for any particular period of time.  She had a lumbar fusion in the past and that was done secondary I believe to lumbar stenosis.  The operative note that we have is from 2014, November 25.     On exam she is alert. She is oriented by 4.  Speech is clear, it is also fluent.  Memory, language, attention span, and fund of knowledge are normal.  She is 60 inches in height, weighs 261 pounds.  BMI is 51.13.  Blood pressure 123/80, pulse is 82, temperature is 97.7.     MEDICATIONS :  She takes Montelukast.     ALLERGIES :  No known drug allergies.     SOCIAL HISTORY :  She is 55 years of age.  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  She does not smoke.  She does not use alcohol. No history of substance abuse.       She had a surgery at the [place] in [facility] by a [place].     Mother and father both deceased, ages 60 and 30.       She reports back, leg and feet problems.  She says sitting, sometimes lying down will relieve the pain. Pain is worse, she is unable to bend her toes.  She has weakness in the feet, headaches, convulsions and seizures. Her weight has been steady.  She has sinus problems, leg pain with walking, leg weakness, back pain, leg pain at rest, joint pain, arthritis, kidney stones, problems with coordination in the legs, anxiety and depression.     PHYSICAL EXAMINATION :  On exam, she is 60 inches, weighs 161 pounds.  BMI is 51, blood pressure 123/80, pulse 83, temperature is 97.7.  She is sitting in a wheelchair in no great distress.  She has 5/5 strength  in the upper extremities, hip flexors, hip extensors, quadriceps, hamstrings. Weakness in dorsiflexion of both feet 4/5, weakness in plantar flexion both feet 4/5.  I did not try to have her stand or walk.  She is obese.  Normal muscle tone and bulk.  Coordination appears to be intact.  Pupils equal, round, react to light.  Full extraocular movements.  Full visual fields. Hearing intact to voice. Symmetric faces.     IMAGING PROCEDURE :  I have an MRI from 2 years ago.  This shows some mild stenosis above the 4-5 fusion.  She also has what appears to be well placed hardware.  There is no incursion upon the canal.  The conus is normal, cauda equina is normal. Paraspinous soft tissues are normal.   Holly Cook comes in today so that we could review the MRI of the lumbar spine.  She had a previous arthrodesis.  Holly Cook had an EMG. Holly Cook returns with the MRI.  What the MRI shows is that she has a significant amount of ligamentum flavum hypertrophy at this level, which is causing lumbar stenosis at  this level also.  She has facet arthropathy, which is contributing to the stenosis.  There is significant lateral recess stenosis and there is an element of retrolisthesis at this level.  Given these findings and given her pain, I do believe it is certainly reasonable for her to undergo operative decompression and to then undergo subsequent arthrodesis since she already has a fusion at 4-5.  She has weakness in dorsiflexion and weakness in plantar flexion.  This is something that is happening.  I did the EMG and she did not have a peripheral neuropathy.  It seems as if the stenosis though may not have seemed that severe is enough that it is causing her this problem.  Given the weakness, I find it difficult to understand why her surgery was not approved.

## 2020-12-20 LAB — GLUCOSE, CAPILLARY
Glucose-Capillary: 128 mg/dL — ABNORMAL HIGH (ref 70–99)
Glucose-Capillary: 113 mg/dL — ABNORMAL HIGH (ref 70–99)
Glucose-Capillary: 123 mg/dL — ABNORMAL HIGH (ref 70–99)
Glucose-Capillary: 109 mg/dL — ABNORMAL HIGH (ref 70–99)

## 2020-12-20 LAB — HEMOGLOBIN A1C
Hgb A1c MFr Bld: 5.6 % (ref 4.8–5.6)
Mean Plasma Glucose: 114.02 mg/dL

## 2020-12-20 NOTE — Evaluation (Signed)
Occupational Therapy Evaluation Patient Details Name: Holly Cook MRN: [id number] DOB: 30-Nov-1965 Today's Date: 12/20/2020    History of Present Illness  55 yo female s/p L3-4 lami and decompression. PMH including CAD, Diabetes mellitus, Sleep apnea, and Stroke (2019).   Clinical Impression   PTA, pt was living with her husband and two adult children and was performing BADLs with assistance from aide; aide coming for three hours each day. Currently, pt requires Mod A for UB ADLs, Mod-Max A for LB ADLs, and Min A for functional mobility using RW. Provided education on back precautions, bed mobility, grooming, and functional transfers; pt demonstrated understanding. Pt would benefit from further acute OT to facilitate safe dc and address LB ADLs. Recommend dc to home with HHOT for further OT to optimize safety, independence with ADLs, and return to PLOF.     Follow Up Recommendations  Home health OT;Supervision/Assistance - 24 hour (CNA)    Equipment Recommendations  None recommended by OT    Recommendations for Other Services PT consult     Precautions / Restrictions Precautions Precautions: Fall      Mobility Bed Mobility Overal bed mobility: Needs Assistance Bed Mobility: Sidelying to Sit;Sit to Sidelying   Sidelying to sit: Min assist     Sit to sidelying: Mod assist General bed mobility comments: Min A to elevate trunk. Mod A for managing BLEs in return to sidlying position.    Transfers Overall transfer level: Needs assistance Equipment used: Rolling walker (2 wheeled) Transfers: Sit to/from Stand Sit to Stand: Min assist         General transfer comment: Min A for power up into standing    Balance Overall balance assessment: Needs assistance Sitting-balance support: No upper extremity supported;Feet supported Sitting balance-Leahy Scale: Fair     Standing balance support: No upper extremity supported;During functional activity;Bilateral upper extremity  supported Standing balance-Leahy Scale: Poor Standing balance comment: Reliant on UE support and/or physical A                           ADL either performed or assessed with clinical judgement   ADL Overall ADL's : Needs assistance/impaired Eating/Feeding: Set up;Sitting   Grooming: Oral care;Minimal assistance;Standing;Cueing for compensatory techniques Grooming Details (indicate cue type and reason): Min A for standing balance as pt fatigued. Pt tempted to lean forward but noting her error and correcting posture. Upper Body Bathing: Moderate assistance;Sitting   Lower Body Bathing: Moderate assistance;Sit to/from stand   Upper Body Dressing : Moderate assistance;Sitting   Lower Body Dressing: Maximal assistance;Sit to/from stand   Toilet Transfer: Minimal assistance;Ambulation;RW (simulated in room)           Functional mobility during ADLs: Minimal assistance;Rolling walker General ADL Comments: Pt presenting with decreasd balance, strength, and cognition. Despite pain and fatigue, pt agreeable to therapy. Providing education on compensatory techniques for bed mobility, grooming, and functional transfers.     Vision Baseline Vision/History: Wears glasses Wears Glasses: Reading only Patient Visual Report: No change from baseline       Perception     Praxis      Pertinent Vitals/Pain Pain Assessment: Faces Faces Pain Scale: Hurts even more Pain Location: Back Pain Descriptors / Indicators: Constant;Discomfort;Grimacing Pain Intervention(s): Monitored during session;Limited activity within patient's tolerance;Repositioned     Hand Dominance Right   Extremity/Trunk Assessment Upper Extremity Assessment Upper Extremity Assessment: Overall WFL for tasks assessed   Lower Extremity Assessment Lower Extremity Assessment: Defer  to PT evaluation   Cervical / Trunk Assessment Cervical / Trunk Assessment: Other exceptions Cervical / Trunk Exceptions: s/p  fusion; increased body habitus   Communication Communication Communication: No difficulties;Other (comment) (Slightly slurred and slow of speech, but feel this is due to pain medication)   Cognition Arousal/Alertness: Awake/alert;Lethargic;Suspect due to medications Behavior During Therapy: [facility] for tasks assessed/performed;Flat affect Overall Cognitive Status: Impaired/Different from baseline Area of Impairment: Attention;Following commands;Problem solving                   Current Attention Level: Sustained;Selective   Following Commands: Follows one step commands with increased time     Problem Solving: Slow processing;Requires verbal cues General Comments: Pt slightly lethargic and sleepy; possibly from pain medication. Requiring increased time and cues thorughout for processing and to following commands   General Comments  Bleeding and sx site at end of session; notifed RN who placed bandage    Exercises     Shoulder Instructions      Home Living Family/patient expects to be discharged to:: Private residence Living Arrangements: Spouse/significant other Available Help at Discharge: Family;Available PRN/intermittently Type of Home: House       Home Layout: Two level;Able to live on main level with bedroom/bathroom Alternate Level Stairs-Number of Steps: Flight   Bathroom Shower/Tub: Teacher, early years/pre: Standard     Home Equipment: Tub bench;Bedside commode          Prior Functioning/Environment Level of Independence: Needs assistance  Gait / Transfers Assistance Needed: Short distances ADL's / Homemaking Assistance Needed: CNA assist with dressing and bathing   Comments: Has a CNA who comes three hours each day (except thursdays)        OT Problem List: Decreased strength;Decreased range of motion;Decreased activity tolerance;Impaired balance (sitting and/or standing);Decreased safety awareness;Decreased coordination;Decreased  knowledge of use of DME or AE;Decreased knowledge of precautions;Pain;Obesity      OT Treatment/Interventions: Self-care/ADL training;Therapeutic exercise;Energy conservation;DME and/or AE instruction;Therapeutic activities;Patient/family education;Balance training    OT Goals(Current goals can be found in the care plan section) Acute Rehab OT Goals Patient Stated Goal: Return home OT Goal Formulation: With patient Time For Goal Achievement: 01/03/21 Potential to Achieve Goals: Good  OT Frequency: Min 2X/week   Barriers to D/C:            Co-evaluation              AM-PAC OT "6 Clicks" Daily Activity     Outcome Measure Help from another person eating meals?: None Help from another person taking care of personal grooming?: A Little Help from another person toileting, which includes using toliet, bedpan, or urinal?: A Lot Help from another person bathing (including washing, rinsing, drying)?: A Lot Help from another person to put on and taking off regular upper body clothing?: A Lot Help from another person to put on and taking off regular lower body clothing?: A Lot 6 Click Score: 15   End of Session Equipment Utilized During Treatment: Rolling walker;Gait belt Nurse Communication: Mobility status  Activity Tolerance: Patient tolerated treatment well Patient left: in bed;with call bell/phone within reach  OT Visit Diagnosis: Unsteadiness on feet (R26.81);Other abnormalities of gait and mobility (R26.89);Muscle weakness (generalized) (M62.81);Pain Pain - part of body:  (Back)                Time: 5427-0623 OT Time Calculation (min): 35 min Charges:  OT General Charges $OT Visit: 1 Visit OT Evaluation $OT Eval Low Complexity: 1  Low OT Treatments $Self Care/Home Management : 8-22 mins  Dreshon Proffit MSOT, OTR/L Acute Rehab Pager: [phone] Office: [place] 12/20/2020, 10:35 AM

## 2020-12-20 NOTE — Evaluation (Signed)
Physical Therapy Evaluation Patient Details Name: Holly Cook MRN: [id number] DOB: 04-27-1966 Today's Date: 12/20/2020   History of Present Illness  55 yo female s/p L3-4 lami and decompression. PMH including CAD, Diabetes mellitus, Sleep apnea, and Stroke (2019).    Clinical Impression  Pt admitted with above diagnosis. At the time of PT eval, pt was able to demonstrate transfers and ambulation with up to mod assist and RW for support. Pt was educated on precautions, appropriate activity progression, and positioning recommendations. Pt currently with functional limitations due to the deficits listed below (see PT Problem List). Pt will benefit from skilled PT to increase their independence and safety with mobility to allow discharge to the venue listed below.      Follow Up Recommendations Home health PT;Supervision for mobility/OOB    Equipment Recommendations  Rolling walker with 5" wheels    Recommendations for Other Services       Precautions / Restrictions Precautions Precautions: Fall;Back Precaution Booklet Issued: Yes (comment) Precaution Comments: Reviewed handout and pt was cued for precautions during functional mobility. Required Braces or Orthoses:  (No brace needed order) Restrictions Weight Bearing Restrictions: No      Mobility  Bed Mobility Overal bed mobility: Needs Assistance Bed Mobility: Sidelying to Sit;Sit to Sidelying   Sidelying to sit: Mod assist     Sit to sidelying: Mod assist General bed mobility comments: Assist for trunk elevation to full sitting position and to elevate LE's back into bed at end of session.    Transfers Overall transfer level: Needs assistance Equipment used: Rolling walker (2 wheeled) Transfers: Sit to/from Stand Sit to Stand: Min assist         General transfer comment: Min A for power up into standing. Very wide BOS with difficulty getting feet closer together and under her to gain/maintain  balance.  Ambulation/Gait Ambulation/Gait assistance: Min assist Gait Distance (Feet): 25 Feet Assistive device: Rolling walker (2 wheeled) Gait Pattern/deviations: Step-to pattern;Decreased stride length;Wide base of support Gait velocity: Decreased Gait velocity interpretation: <1.31 ft/sec, indicative of household ambulator General Gait Details: Wide BOS with short step/stride length and difficulty maneuvering walker.  Stairs            Wheelchair Mobility    Modified Rankin (Stroke Patients Only)       Balance Overall balance assessment: Needs assistance Sitting-balance support: No upper extremity supported;Feet supported Sitting balance-Leahy Scale: Fair     Standing balance support: No upper extremity supported;During functional activity;Bilateral upper extremity supported Standing balance-Leahy Scale: Poor Standing balance comment: Reliant on UE support and/or physical A                             Pertinent Vitals/Pain Pain Assessment: Faces Faces Pain Scale: Hurts even more Pain Location: Back Pain Descriptors / Indicators: Constant;Discomfort;Grimacing Pain Intervention(s): Limited activity within patient's tolerance;Monitored during session;Repositioned    Home Living Family/patient expects to be discharged to:: Private residence Living Arrangements: Spouse/significant other Available Help at Discharge: Family;Available PRN/intermittently;Personal care attendant Type of Home: House Home Access: Stairs to enter   Entrance Stairs-Number of Steps: 1 Home Layout: Two level;Able to live on main level with bedroom/bathroom Home Equipment: Tub bench;Bedside commode;Walker - 4 wheels;Electric scooter;Wheelchair - manual      Prior Function Level of Independence: Needs assistance   Gait / Transfers Assistance Needed: Ambulates short distances and uses rollator, wheelchair, or scooter.  ADL's / Homemaking Assistance Needed: CNA assist with  dressing and bathing  Comments: CNA 3 hours MWF; 4 hours Tues     Hand Dominance   Dominant Hand: Right    Extremity/Trunk Assessment   Upper Extremity Assessment Upper Extremity Assessment: Defer to OT evaluation    Lower Extremity Assessment Lower Extremity Assessment: Generalized weakness    Cervical / Trunk Assessment Cervical / Trunk Assessment: Other exceptions Cervical / Trunk Exceptions: s/p fusion  Communication   Communication: No difficulties;Other (comment) (Slightly slurred and slow of speech, but feel this is due to pain medication)  Cognition Arousal/Alertness: Awake/alert;Lethargic;Suspect due to medications Behavior During Therapy: [facility] for tasks assessed/performed;Flat affect Overall Cognitive Status: Impaired/Different from baseline Area of Impairment: Attention;Following commands;Problem solving                   Current Attention Level: Sustained;Selective   Following Commands: Follows one step commands with increased time     Problem Solving: Slow processing;Requires verbal cues General Comments: Pt slightly lethargic and sleepy; possibly from pain medication. Requiring increased time and cues thorughout for processing and to following commands      General Comments General comments (skin integrity, edema, etc.): Bleeding and sx site at end of session; notifed RN who placed bandage    Exercises     Assessment/Plan    PT Assessment Patient needs continued PT services  PT Problem List Decreased strength;Decreased activity tolerance;Decreased balance;Decreased mobility;Decreased knowledge of use of DME;Decreased safety awareness;Decreased knowledge of precautions;Pain       PT Treatment Interventions DME instruction;Stair training;Gait training;Functional mobility training;Therapeutic activities;Therapeutic exercise;Neuromuscular re-education;Patient/family education    PT Goals (Current goals can be found in the Care Plan section)   Acute Rehab PT Goals Patient Stated Goal: Return home PT Goal Formulation: With patient Time For Goal Achievement: 12/27/20 Potential to Achieve Goals: Good    Frequency Min 5X/week   Barriers to discharge        Co-evaluation               AM-PAC PT "6 Clicks" Mobility  Outcome Measure Help needed turning from your back to your side while in a flat bed without using bedrails?: A Little Help needed moving from lying on your back to sitting on the side of a flat bed without using bedrails?: A Little Help needed moving to and from a bed to a chair (including a wheelchair)?: A Little Help needed standing up from a chair using your arms (e.g., wheelchair or bedside chair)?: A Little Help needed to walk in hospital room?: A Little Help needed climbing 3-5 steps with a railing? : Total 6 Click Score: 16    End of Session Equipment Utilized During Treatment: Gait belt Activity Tolerance: Patient tolerated treatment well Patient left: in bed;with call bell/phone within reach Nurse Communication: Mobility status PT Visit Diagnosis: Unsteadiness on feet (R26.81);Pain Pain - part of body:  (back)    Time: 6270-3500 PT Time Calculation (min) (ACUTE ONLY): 23 min   Charges:   PT Evaluation $PT Eval Low Complexity: 1 Low PT Treatments $Gait Training: 8-22 mins        Rolinda Roan, PT, DPT Acute Rehabilitation Services Pager: [phone] Office: [phone]   Holly Cook 12/20/2020, 12:32 PM

## 2020-12-21 LAB — GLUCOSE, CAPILLARY: Glucose-Capillary: 92 mg/dL (ref 70–99)

## 2020-12-21 MED ORDER — OXYCODONE HCL 5 MG PO TABS: 5.0000 mg | ORAL_TABLET | ORAL | 0 refills | Status: DC | PRN

## 2020-12-21 NOTE — Plan of Care (Signed)
Patient is discharged from room 3C11 at this time. Alert and in stable condition. IV site d/c'd and instructions read to patient with understanding verbalized and all questions answered. Left unit via wheelchair with all belongings at side. 

## 2020-12-21 NOTE — TOC Transition Note (Signed)
Transition of Care [facility]) - CM/SW Discharge Note   Patient Details  Name: Holly Cook MRN: [id number] Date of Birth: 1966-08-22  [facility] CM/SW Contact:  Carles Collet, RN Phone Number: 12/21/2020, 10:59 AM   Clinical Narrative:    Spoke w patient at bedside. She does not have Secretary provider preference. Ia agreeable to [facility], referral placed. Unit staff to provide DME needed.     Final next level of care: [place] Barriers to Discharge: No Barriers Identified   Patient Goals and CMS Choice Patient states their goals for this hospitalization and ongoing recovery are:: to go home CMS Medicare.gov Compare Post Acute Care list provided to:: Patient Choice offered to / list presented to : Patient  Discharge Placement                       Discharge Plan and Services                          HH Arranged: PT,OT [place]: [place] Date [place]: 12/21/20 Time [place]: 1059 Representative spoke with at [place]: [place] ([place]) Interventions     Readmission Risk Interventions No flowsheet data found.

## 2020-12-21 NOTE — Progress Notes (Signed)
Physical Therapy Treatment Patient Details Name: Holly Cook MRN: [id number] DOB: 25-Jul-1966 Today's Date: 12/21/2020    History of Present Illness 55 yo female s/p L3-4 lami and decompression. PMH including CAD, Diabetes mellitus, Sleep apnea, and Stroke (2019).    PT Comments    Pt is progressing well towards goals. She increased ambulation distance with RW. Reviewed back precautions with pt. Pt is expected to d/c today and would benefit from HHPT to maximize functional independence and increase safety with mobility.    Follow Up Recommendations  Home health PT;Supervision for mobility/OOB     Equipment Recommendations  Rolling walker with 5" wheels    Recommendations for Other Services       Precautions / Restrictions Precautions Precautions: Fall;Back Precaution Booklet Issued: Yes (comment) Precaution Comments: able to recall 1/3 back precautions. Reviewed BLT Required Braces or Orthoses:  (No brace needed order)    Mobility  Bed Mobility               General bed mobility comments: up in chair on arrival    Transfers Overall transfer level: Needs assistance Equipment used: Rolling walker (2 wheeled) Transfers: Sit to/from Stand Sit to Stand: Min assist         General transfer comment: Min A for power up into standing. Very wide BOS. Cues for eccentric control when sitting and hand placement with RW.  Ambulation/Gait Ambulation/Gait assistance: Min assist Gait Distance (Feet): 90 Feet Assistive device: Rolling walker (2 wheeled) Gait Pattern/deviations: Step-to pattern;Decreased stride length;Wide base of support Gait velocity: Decreased Gait velocity interpretation: <1.31 ft/sec, indicative of household ambulator General Gait Details: Wide BOS with short step/stride length and difficulty maneuvering walker. VC for RW proximity and increased step length.   Stairs             Wheelchair Mobility    Modified Rankin (Stroke Patients  Only)       Balance Overall balance assessment: Needs assistance Sitting-balance support: No upper extremity supported;Feet supported Sitting balance-Leahy Scale: Fair     Standing balance support: No upper extremity supported;During functional activity;Bilateral upper extremity supported Standing balance-Leahy Scale: Poor Standing balance comment: Reliant on UE support and/or physical A                            Cognition Arousal/Alertness: Awake/alert;Lethargic;Suspect due to medications Behavior During Therapy: [facility] for tasks assessed/performed;Flat affect Overall Cognitive Status: Impaired/Different from baseline Area of Impairment: Attention;Following commands;Problem solving                   Current Attention Level: Sustained;Selective   Following Commands: Follows one step commands with increased time     Problem Solving: Slow processing;Requires verbal cues General Comments: Slightly confused and lethargic. When she saw the hospital enterance through a window, asked if it was a school. Slow processing.      Exercises      General Comments        Pertinent Vitals/Pain Pain Assessment: Faces Faces Pain Scale: Hurts even more Pain Location: Back with movement Pain Descriptors / Indicators: Constant;Discomfort;Grimacing Pain Intervention(s): Monitored during session;Limited activity within patient's tolerance    Home Living                      Prior Function            PT Goals (current goals can now be found in the care plan section) Acute Rehab  PT Goals Patient Stated Goal: Return home PT Goal Formulation: With patient Time For Goal Achievement: 12/27/20 Potential to Achieve Goals: Good Progress towards PT goals: Progressing toward goals    Frequency    Min 5X/week      PT Plan Current plan remains appropriate    Co-evaluation              AM-PAC PT "6 Clicks" Mobility   Outcome Measure  Help needed  turning from your back to your side while in a flat bed without using bedrails?: A Little Help needed moving from lying on your back to sitting on the side of a flat bed without using bedrails?: A Little Help needed moving to and from a bed to a chair (including a wheelchair)?: A Little Help needed standing up from a chair using your arms (e.g., wheelchair or bedside chair)?: A Little Help needed to walk in hospital room?: A Little Help needed climbing 3-5 steps with a railing? : A Lot 6 Click Score: 17    End of Session Equipment Utilized During Treatment: Gait belt Activity Tolerance: Patient tolerated treatment well Patient left: with call bell/phone within reach;in chair Nurse Communication: Mobility status PT Visit Diagnosis: Unsteadiness on feet (R26.81);Pain Pain - part of body:  (back)     Time: 9163-8466 PT Time Calculation (min) (ACUTE ONLY): 17 min  Charges:  $Gait Training: 8-22 mins                     Holly Cook, [place] Pager [phone] Acute Rehab  Allena Katz 12/21/2020, 10:11 AM

## 2020-12-21 NOTE — Discharge Instructions (Signed)
Wound Care Remove dressing in 3 days and change as needed Leave incision open to air. You may shower. Do not scrub directly on incision.  Do not put any creams, lotions, or ointments on incision. Activity Walk each and every day, increasing distance each day. No lifting greater than 5 lbs.  Avoid bending, arching, and twisting. No driving for 2 weeks; may ride as a passenger locally. If provided with back brace, wear when out of bed.  It is not necessary to wear in bed. Diet Resume your normal diet.  Return to Work Will be discussed at you follow up appointment. Call Your Doctor If Any of These Occur Redness, drainage, or swelling at the wound.  Temperature greater than 101 degrees. Severe pain not relieved by pain medication. Incision starts to come apart. Follow Up Appt Call today for appointment in 3-4 weeks ([phone]) or for problems.  If you have any hardware placed in your spine, you will need an x-ray before your appointment.

## 2020-12-21 NOTE — Discharge Summary (Signed)
Physician Discharge Summary  Patient ID: Holly Cook MRN: [id number] DOB/AGE: [birth year] 56 y.o.  Admit date: 12/19/2020 Discharge date: 12/21/2020  Admission Diagnoses: Lumbar stenosis with neurogenic claudication  Discharge Diagnoses:  Active Problems:   Spinal stenosis   Lumbar stenosis with neurogenic claudication   Discharged Condition: good  Hospital Course: Patient underwent an L4-5 posterior lumbar fusion by Dr. Christella Noa on 12/19/2020. She was admitted to 3C[facility] overnight following recovery from anesthesia in the PACU. Her postoperative course has been uncomplicated. She has worked with both physical and occupational therapies who are recommending home health PT and OT. She is ambulating independently and without difficulty. She is tolerating a normal diet. She is not having any bowel or bladder dysfunction. Her pain is well-controlled with oral pain medication. She is ready for discharge home.   Consults: None  Significant Diagnostic Studies: radiology: DG Lumbar Spine 2-3 Views  Result Date: 12/19/2020 CLINICAL DATA:  Surgery, elective. Additional history provided: Lumbar 3-4 decompression with pedicle screw fixation from lumbar 3-5. Provided fluoroscopy time 51 seconds (82.70 mGy). EXAM: LUMBAR SPINE - 2-3 VIEW; DG C-ARM 1-60 MIN COMPARISON:  Lumbar spine radiographs 05/14/2020. FINDINGS: PA and lateral view intraoperative fluoroscopic images of the lumbar spine are submitted, 2 images total. These images demonstrate new bilateral pedicle screws at the L3 level. Redemonstrated posterior spinal fusion construct (bilateral pedicle screws and vertical interconnecting rods) at the L4-L5 level. Overlying retractors. IMPRESSION: Two intraoperative fluoroscopic images of the lumbar spine, as described. Electronically Signed   By: Kellie Simmering DO   On: 12/19/2020 17:18   DG C-Arm 1-60 Min  Result Date: 12/19/2020 CLINICAL DATA:  Surgery, elective. Additional history provided: Lumbar 3-4  decompression with pedicle screw fixation from lumbar 3-5. Provided fluoroscopy time 51 seconds (82.70 mGy). EXAM: LUMBAR SPINE - 2-3 VIEW; DG C-ARM 1-60 MIN COMPARISON:  Lumbar spine radiographs 05/14/2020. FINDINGS: PA and lateral view intraoperative fluoroscopic images of the lumbar spine are submitted, 2 images total. These images demonstrate new bilateral pedicle screws at the L3 level. Redemonstrated posterior spinal fusion construct (bilateral pedicle screws and vertical interconnecting rods) at the L4-L5 level. Overlying retractors. IMPRESSION: Two intraoperative fluoroscopic images of the lumbar spine, as described. Electronically Signed   By: Kellie Simmering DO   On: 12/19/2020 17:18    Treatments: surgery: LUMBAR THREE-FOUR laminectomy for DECOMPRESSON. Posterolateral arthrodesis with autograft morsels L3/4 Non segmental pedicle screw fixation L3/4(medtronic hardware)   Discharge Exam: Blood pressure (!) 88/66, pulse 76, temperature 98.3 F (36.8 C), temperature source Oral, resp. rate 18, height 5\' 4"  (1.626 m), weight 115.7 kg, SpO2 96 %.   Alert and oriented x 4 PERRLA CN II-XII grossly intact MAE, Strength and sensation grossly intact Incision is covered with gauze dressing; Dressing is clean, dry, and intact   Disposition: Discharge disposition: 01-Home or Self Care       Discharge Instructions    Face-to-face encounter (required for Medicare/Medicaid patients)   Complete by: As directed    I Patricia Nettle certify that this patient is under my care and that I, or a nurse practitioner or physician's assistant working with me, had a face-to-face encounter that meets the physician face-to-face encounter requirements with this patient on 12/21/2020. The encounter with the patient was in whole, or in part for the following medical condition(s) which is the primary reason for home health care (List medical condition): Lumbar spinal stenosis   The encounter with the patient was  in whole, or in part, for the  following medical condition, which is the primary reason for home health care: Lumbar spinal stenosis   I certify that, based on my findings, the following services are medically necessary home health services: Physical therapy   Reason for Medically Necessary Home Health Services: Therapy- Therapeutic Exercises to Increase Strength and Endurance   My clinical findings support the need for the above services: Unable to leave home safely without assistance and/or assistive device   Further, I certify that my clinical findings support that this patient is homebound due to: Unable to leave home safely without assistance   Home Health   Complete by: As directed    To provide the following care/treatments:  PT OT       Allergies as of 12/21/2020      Reactions   Tetanus Toxoids Other (See Comments)   Flu-like symptoms, injection site reaction; treated with antibiotics.  Not certain if reaction to vaccine or infection of injection site      Medication List    STOP taking these medications   ibuprofen 200 MG tablet Commonly known as: ADVIL     TAKE these medications   aspirin EC 81 MG tablet Take 81 mg by mouth every other day. Swallow whole.   atorvastatin 20 MG tablet Commonly known as: LIPITOR Take 1 tablet (20 mg total) by mouth daily at 6 PM.   carvedilol 25 MG tablet Commonly known as: COREG Take 12.5 mg by mouth 2 (two) times daily with a meal.   cyclobenzaprine 10 MG tablet Commonly known as: FLEXERIL Take 1 tablet (10 mg total) by mouth 2 (two) times daily as needed for muscle spasms.   gabapentin 300 MG capsule Commonly known as: NEURONTIN Take 300 mg by mouth at bedtime.   levETIRAcetam 500 MG tablet Commonly known as: KEPPRA Take 500 mg by mouth in the morning, at noon, and at bedtime.   montelukast 10 MG tablet Commonly known as: SINGULAIR Take 10 mg by mouth at bedtime.   oxyCODONE 5 MG immediate release tablet Commonly known  as: Oxy IR/ROXICODONE Take 1 tablet (5 mg total) by mouth every 4 (four) hours as needed for moderate pain ((score 4 to 6)).   pantoprazole 40 MG tablet Commonly known as: PROTONIX Take 40 mg by mouth daily.   sertraline 100 MG tablet Commonly known as: ZOLOFT Take 200 mg by mouth daily.   traZODone 100 MG tablet Commonly known as: DESYREL Take 50-100 mg by mouth at bedtime.       Follow-up Information    [facility] Follow up.   Specialty: Home Health Services Contact information: [place] [address] [phone]        Ashok Pall, MD. Schedule an appointment as soon as possible for a visit in 2 week(s).   Specialty: Neurosurgery Contact information: [address] [place] [address] [phone]               Signed: Patricia Nettle 12/21/2020, 11:48 AM

## 2020-12-21 NOTE — Progress Notes (Signed)
Occupational Therapy Progress Note  Pt requires min guard to mod A for ADLs.  She requires mod prompting to participate and demonstrates poor carry over of precautions and information provided.  She demonstrates deficits with memory and problem solving.Marland Kitchen  She was instructed in use of toileting aid.  She reports her spouse and HHaide will assist her with ADLs at home.    Recommend HHOT.    12/21/20 1018  OT Visit Information  Last OT Received On 12/21/20  Assistance Needed +1  History of Present Illness 55 yo female s/p L3-4 lami and decompression. PMH including CAD, Diabetes mellitus, Sleep apnea, and Stroke (2019).  Precautions  Precautions Fall;Back  Precaution Booklet Issued Yes (comment)  Precaution Comments Pt able to recall 1/3 back precautions even after just going over them with PT.  She requires mod cues to adhere to precautions  Pain Assessment  Pain Assessment Faces  Faces Pain Scale 4  Pain Location Back with movement  Pain Descriptors / Indicators Operative site guarding  Pain Intervention(s) Premedicated before session  Cognition  Arousal/Alertness Awake/alert;Lethargic;Suspect due to medications  Behavior During Therapy Flat affect  Overall Cognitive Status Impaired/Different from baseline  Area of Impairment Attention;Memory;Following commands;Safety/judgement;Problem solving  Current Attention Level Sustained  Memory Decreased recall of precautions  Following Commands Follows one step commands with increased time  Safety/Judgement Decreased awareness of safety  Problem Solving Slow processing;Difficulty sequencing;Requires verbal cues;Requires tactile cues  General Comments Pt very slow to respond to questions.  She was unable to recall 2/3 precautions with cues.   She took her arms out of the sleeves of her shirt while leaving her shirt around her neck in order to don a camisole, then couldn't remember where her shirt was located.  Upper Extremity Assessment  Upper  Extremity Assessment Generalized weakness  Lower Extremity Assessment  Lower Extremity Assessment Defer to PT evaluation  ADL  Overall ADL's  Needs assistance/impaired  Lower Body Bathing Details (indicate cue type and reason) pt reports her aid will assist her with bathing  Upper Body Dressing  Supervision/safety;Sitting  Lower Body Dressing Moderate assistance;Sit to/from stand  Lower Body Dressing Details (indicate cue type and reason) Pt reports she donned her pants by herself this am.  She was able to demonstrate safe technique during simulation.  She reports someone will assist her with socks and shoes  Toilet Transfer Min guard;Ambulation;Comfort height toilet;RW  Armed forces technical officer Details (indicate cue type and reason) mod verbal cues for safety and correct technqiue  Toileting- Clothing Manipulation and Hygiene Moderate assistance;Sit to/from stand  Toileting - Clothing Manipulation Details (indicate cue type and reason) Pt unable to access peri area safely.  Pt instructed in use of toileting aid  Functional mobility during ADLs Min guard;Rolling walker  Bed Mobility  General bed mobility comments pt sitting up in chair  Balance  Overall balance assessment Needs assistance  Sitting-balance support No upper extremity supported;Feet supported  Sitting balance-Leahy Scale Fair  Standing balance support No upper extremity supported;During functional activity  Standing balance-Leahy Scale Fair  Standing balance comment able to maintain static standing for brief periods with min guard assist  Transfers  Overall transfer level Needs assistance  Equipment used Rolling walker (2 wheeled)  Transfers Sit to/from [employer] Transfers  Sit to Stand Min guard;Min assist  Stand pivot transfers Min guard  General transfer comment Min A to power up from recliner; min guard for sit to stand from toilet.  Discussed need for firm chair with armrests to  sit in at home  General Comments   General comments (skin integrity, edema, etc.) Pt required mod prompting to participate with OT  OT - End of Session  Equipment Utilized During Treatment Rolling walker  Activity Tolerance Patient tolerated treatment well  Patient left in chair;with call bell/phone within reach  Nurse Communication Mobility status  OT Assessment/Plan  OT Plan Discharge plan remains appropriate  OT Visit Diagnosis Unsteadiness on feet (R26.81);Pain  Pain - part of body  (back)  OT Frequency (ACUTE ONLY) Min 2X/week  Follow Up Recommendations Home health OT;Supervision/Assistance - 24 hour  OT Equipment None recommended by OT  AM-PAC OT "6 Clicks" Daily Activity Outcome Measure (Version 2)  Help from another person eating meals? 4  Help from another person taking care of personal grooming? 3  Help from another person toileting, which includes using toliet, bedpan, or urinal? 2  Help from another person bathing (including washing, rinsing, drying)? 2  Help from another person to put on and taking off regular upper body clothing? 3  Help from another person to put on and taking off regular lower body clothing? 3  6 Click Score 17  OT Goal Progression  Progress towards OT goals Progressing toward goals  OT Time Calculation  OT Start Time (ACUTE ONLY) 0858  OT Stop Time (ACUTE ONLY) 0936  OT Time Calculation (min) 38 min  OT General Charges  $OT Visit 1 Visit  OT Treatments  $Self Care/Home Management  38-52 mins  Nilsa Nutting., OTR/L Acute Rehabilitation Services Pager [phone] Office [phone]

## 2020-12-23 ENCOUNTER — Telehealth: Payer: Self-pay

## 2020-12-23 NOTE — Telephone Encounter (Signed)
Transition Care Management Follow-up Telephone Call  Date of discharge and from where: 12/21/2020, [facility]   How have you been since you were released from the hospital? She said she has been having a lot of back pain.  She rates the pain 8-9/10 and has been taking the oxycodone every 4 hours with little relief of the pain. Instructed her to speak to neurosurgeon about the pain as well as schedule a follow up appointment  Any questions or concerns? Yes - noted above  Items Reviewed:  Did the pt receive and understand the discharge instructions provided? Yes   Medications obtained and verified? Yes  - she has all medications and did not have any questions about the med regime.   Other? No   Any new allergies since your discharge? No   Do you have support at home? Yes   Home Care and Equipment/Supplies: Were home health services ordered? yes If so, what is the name of the agency? [facility] - home PT and OT  Has the agency set up a time to come to the patient's home? No - reminded the patient that the phone number for [facility] is on page 2 of her AVS.She also noted that she has not checked her voicemail recently. Were any new equipment or medical supplies ordered?  No What is the name of the medical supply agency?n/a Were you able to get the supplies/equipment? not applicable Do you have any questions related to the use of the equipment or supplies? No   She has a RW but said that she does not need to use it much because her husband helps her.  She said that the dressing remains intact on her back.   Functional Questionnaire: (I = Independent and D = Dependent) ADLs: -dependent on assistance with ADLS from her husband.  She said she has not been needing her walker because her husband assists with transfers, ambulation, personal care.    Follow up appointments reviewed:   PCP Hospital f/u appt confirmed? Yes  [name], NP 12/31/2020.   [facility] f/u appt  confirmed? no appointment yet, instructed her to call today to schedule with neurosurgeon  Are transportation arrangements needed? No   If their condition worsens, is the pt aware to call PCP or go to the Emergency Dept.? Yes  Was the patient provided with contact information for the PCP's office or ED? Yes  Was to pt encouraged to call back with questions or concerns? Yes

## 2020-12-31 ENCOUNTER — Ambulatory Visit: Payer: No Typology Code available for payment source | Attending: Nurse Practitioner | Admitting: Nurse Practitioner

## 2020-12-31 ENCOUNTER — Other Ambulatory Visit: Payer: Self-pay

## 2020-12-31 ENCOUNTER — Encounter: Payer: Self-pay | Admitting: Nurse Practitioner

## 2020-12-31 DIAGNOSIS — Z1159 Encounter for screening for other viral diseases: Secondary | ICD-10-CM | POA: Diagnosis not present

## 2020-12-31 DIAGNOSIS — Z87898 Personal history of other specified conditions: Secondary | ICD-10-CM | POA: Diagnosis not present

## 2020-12-31 DIAGNOSIS — G4733 Obstructive sleep apnea (adult) (pediatric): Secondary | ICD-10-CM

## 2020-12-31 DIAGNOSIS — M5416 Radiculopathy, lumbar region: Secondary | ICD-10-CM | POA: Diagnosis not present

## 2020-12-31 DIAGNOSIS — K219 Gastro-esophageal reflux disease without esophagitis: Secondary | ICD-10-CM

## 2020-12-31 DIAGNOSIS — Z720 Tobacco use: Secondary | ICD-10-CM

## 2020-12-31 DIAGNOSIS — E785 Hyperlipidemia, unspecified: Secondary | ICD-10-CM | POA: Diagnosis not present

## 2020-12-31 DIAGNOSIS — F5104 Psychophysiologic insomnia: Secondary | ICD-10-CM

## 2020-12-31 DIAGNOSIS — J3089 Other allergic rhinitis: Secondary | ICD-10-CM

## 2020-12-31 DIAGNOSIS — E876 Hypokalemia: Secondary | ICD-10-CM

## 2020-12-31 DIAGNOSIS — Z114 Encounter for screening for human immunodeficiency virus [HIV]: Secondary | ICD-10-CM

## 2020-12-31 MED ORDER — PANTOPRAZOLE SODIUM 40 MG PO TBEC: 40.0000 mg | DELAYED_RELEASE_TABLET | Freq: Every day | ORAL | 1 refills | Status: AC

## 2020-12-31 MED ORDER — ALBUTEROL SULFATE HFA 108 (90 BASE) MCG/ACT IN AERS
2.0000 | INHALATION_SPRAY | Freq: Four times a day (QID) | RESPIRATORY_TRACT | 2 refills | Status: AC | PRN
Start: 2020-12-31 — End: ?

## 2020-12-31 MED ORDER — MONTELUKAST SODIUM 10 MG PO TABS: 10.0000 mg | ORAL_TABLET | Freq: Every day | ORAL | 1 refills | Status: AC

## 2020-12-31 MED ORDER — ATORVASTATIN CALCIUM 20 MG PO TABS
20.0000 mg | ORAL_TABLET | Freq: Every day | ORAL | 3 refills | Status: AC
Start: 2020-12-31 — End: 2024-08-29

## 2020-12-31 MED ORDER — ACETAMINOPHEN-CODEINE #3 300-30 MG PO TABS: 1.0000 | ORAL_TABLET | Freq: Four times a day (QID) | ORAL | 0 refills | Status: AC | PRN

## 2020-12-31 MED ORDER — TRAZODONE HCL 100 MG PO TABS: 50.0000 mg | ORAL_TABLET | Freq: Every day | ORAL | 3 refills | Status: AC

## 2020-12-31 NOTE — Progress Notes (Signed)
Virtual Visit via Telephone Note Due to national recommendations of social distancing due to [place] 19, telehealth visit is felt to be most appropriate for this patient at this time.  I discussed the limitations, risks, security and privacy concerns of performing an evaluation and management service by telephone and the availability of in person appointments. I also discussed with the patient that there may be a patient responsible charge related to this service. The patient expressed understanding and agreed to proceed.    I connected with Marcelyn Ditty on 12/31/20  at  10:50 AM EST  EDT by telephone and verified that I am speaking with the correct person using two identifiers.   Consent I discussed the limitations, risks, security and privacy concerns of performing an evaluation and management service by telephone and the availability of in person appointments. I also discussed with the patient that there may be a patient responsible charge related to this service. The patient expressed understanding and agreed to proceed.   Location of Patient: Private  Residence   Location of Provider: [place] and [employer] Office    Persons participating in Telemedicine visit: [name] FNP-BC [place]    History of Present Illness: Telemedicine visit for: Follow Up  has a past medical history of CAD (coronary artery disease), Diabetes mellitus without complication ([place]), Sleep apnea, and Stroke ([place]) (2019).  She is also receiving care through the [facility] clinic    Currently s/p lumbar decompression L3-4 with pedicle screw fixation L3-5 (2 weeks ago) She is requesting pain medicine today.  I will give her a courtesy fill of Tylenol 3 and any additional pain medication should be managed by the spine specialist.   OSA She is not using her CPAP machine. She states the mask did not fit right so she stopped using it. She has not used her CPAP in over a year. Symptoms of  sleep apnea include the following:  SNORING OBESITY NIGHTTIME AWAKENINGS DAYTIME FATIGUE    SEIZURES States she has frequent seizures and takes keppra 500 mg TID for this.  She has never been evaluated by a neurologist nor has she had an EEG per her report.  States she was diagnosed with seizures by the [place] but never had a formal work-up. Seizures occurring every few weeks. Based on her report of "internal seizures occurring in my head and I just go blank" she may be describing petit mal seizures. She also has a history of stroke but states she can not recall if she ever saw a neurologist.    Past Medical History:  Diagnosis Date  . CAD (coronary artery disease)   . Diabetes mellitus without complication [facility])    patient states she was told this one but time but she doesn't have it  . Sleep apnea   . Stroke [facility]) 2019   "slight stroke"     Past Surgical History:  Procedure Laterality Date  . BACK SURGERY     lumbar spine  . HERNIA REPAIR    . wrist and arm surgery      Family History  Problem Relation Age of Onset  . Neuropathy Neg Hx     Social History   Socioeconomic History  . Marital status: Married    Spouse name: Not on file  . Number of children: Not on file  . Years of education: Not on file  . Highest education level: Not on file  Occupational History  . Not on file  Tobacco Use  . Smoking status: Never Smoker  . Smokeless tobacco: Never Used  Substance and Sexual Activity  . Alcohol use: No  . Drug use: No  . Sexual activity: Yes    Birth control/protection: None  Other Topics Concern  . Not on file  Social History Narrative   Lives in independent living at [facility]    Right handed   Caffeine: maybe 2-3 cups daily   Social Determinants of Health   Financial Resource Strain: Not on file  Food Insecurity: Not on file  Transportation Needs: Not on file  Physical Activity: Not on file  Stress: Not on file  Social Connections: Not on file      Observations/Objective: Awake, alert and oriented x 3   Review of Systems  Constitutional: Negative for fever, malaise/fatigue and weight loss.  HENT: Negative.  Negative for nosebleeds.   Eyes: Negative.  Negative for blurred vision, double vision and photophobia.  Respiratory: Negative.  Negative for cough and shortness of breath.   Cardiovascular: Negative.  Negative for chest pain, palpitations and leg swelling.  Gastrointestinal: Positive for heartburn. Negative for nausea and vomiting.  Musculoskeletal: Positive for back pain. Negative for myalgias.  Neurological: Positive for seizures. Negative for dizziness, focal weakness and headaches.  Endo/Heme/Allergies: Positive for environmental allergies.  Psychiatric/Behavioral: Positive for depression. Negative for suicidal ideas. The patient has insomnia.     Assessment and Plan: Diagnoses and all orders for this visit:  History of seizures -     Ambulatory referral to Neurology  OSA (obstructive sleep apnea) -     Split night study; Future  Lumbar radiculopathy -     acetaminophen-codeine (TYLENOL #3) 300-30 MG tablet; Take 1 tablet by mouth every 6 (six) hours as needed for moderate pain. COURTESY FILL ONE TIME ONLY  Psychophysiological insomnia -     traZODone (DESYREL) 100 MG tablet; Take 0.5-1 tablets (50-100 mg total) by mouth at bedtime.  Environmental and seasonal allergies -     montelukast (SINGULAIR) 10 MG tablet; Take 1 tablet (10 mg total) by mouth at bedtime. -     albuterol (VENTOLIN HFA) 108 (90 Base) MCG/ACT inhaler; Inhale 2 puffs into the lungs every 6 (six) hours as needed for wheezing or shortness of breath.   Dyslipidemia, goal LDL below 100 -     atorvastatin (LIPITOR) 20 MG tablet; Take 1 tablet (20 mg total) by mouth daily at 6 PM. -     Lipid panel; Future  GERD without esophagitis -     pantoprazole (PROTONIX) 40 MG tablet; Take 1 tablet (40 mg total) by mouth daily. INSTRUCTIONS: Avoid GERD  Triggers: acidic, spicy or fried foods, caffeine, coffee, sodas,  alcohol and chocolate.    Need for hepatitis C screening test -     HCV Ab w Reflex to Quant PCR; Future  Encounter for screening for HIV -     HIV antibody (with reflex); Future  Hypokalemia -     CMP14+EGFR; Future     Follow Up Instructions Return for PAP SMEAR.     I discussed the assessment and treatment plan with the patient. The patient was provided an opportunity to ask questions and all were answered. The patient agreed with the plan and demonstrated an understanding of the instructions.   The patient was advised to call back or seek an in-person evaluation if the symptoms worsen or if the condition fails to improve as anticipated.  I provided 17 minutes of non-face-to-face time  during this encounter including median intraservice time, reviewing previous notes, labs, imaging, medications and explaining diagnosis and management.  Naod Sweetland W Tyrease Vandeberg, FNP-BC 

## 2021-01-22 ENCOUNTER — Other Ambulatory Visit: Payer: Self-pay | Admitting: Nurse Practitioner

## 2021-01-22 DIAGNOSIS — M5416 Radiculopathy, lumbar region: Secondary | ICD-10-CM

## 2021-01-22 NOTE — Telephone Encounter (Signed)
Requested medication (s) are due for refill today: Yes  Requested medication (s) are on the active medication list: Yes  Last refill:  12/31/20  Future visit scheduled: No  Notes to clinic:  See request.    Requested Prescriptions  Pending Prescriptions Disp Refills   acetaminophen-codeine (TYLENOL #3) 300-30 MG tablet [Pharmacy Med Name: ACETAMINOPHEN/COD #3 (300/30MG ) TAB] 30 tablet     Sig: TAKE 1 TABLET BY MOUTH EVERY 6 HOURS AS NEEDED FOR MODERATE PAIN      Not Delegated - Analgesics:  Opioid Agonist Combinations Failed - 01/22/2021 11:54 AM      Failed - This refill cannot be delegated      Failed - Urine Drug Screen completed in last 360 days      Passed - Valid encounter within last 6 months    Recent Outpatient Visits           3 weeks ago History of seizures   [place], Vernia Buff, NP   3 months ago Hair loss   [place], [place]   10 months ago Small fiber neuropathy   [facility] [place], Vernia Buff, NP

## 2021-01-28 ENCOUNTER — Ambulatory Visit: Payer: Self-pay | Admitting: *Deleted

## 2021-01-28 NOTE — Telephone Encounter (Signed)
Pt called with complaints of intermittent dizziness since 12/19/20; she feels like the room is spinning;  Recommendations made per nurse triage protocol;  Decision tree completed; the pt sees [name] at [place]; she has no availability within time frame per protocol; pt offered and accepted virtual appt with Freeman Caldron 01/29/21 at 1550; will route to office for notification. Reason for Disposition . [1] MODERATE dizziness (e.g., interferes with normal activities) AND [2] has been evaluated by physician for this  Answer Assessment - Initial Assessment Questions 1. DESCRIPTION: "Describe your dizziness."     Room spinning 2. LIGHTHEADED: "Do you feel lightheaded?" (e.g., somewhat faint, woozy, weak upon standing)     Worse when standing 3. VERTIGO: "Do you feel like either you or the room is spinning or tilting?" (i.e. vertigo)   yes 4. SEVERITY: "How bad is it?"  "Do you feel like you are going to faint?" "Can you stand and walk?"   - MILD: Feels slightly dizzy, but walking normally.   - MODERATE: Feels very unsteady when walking, but not falling; interferes with normal activities (e.g., school, work) .   - SEVERE: Unable to walk without falling, or requires assistance to walk without falling; feels like passing out now.     Moderate to severe; fell over on bed 5. ONSET:  "When did the dizziness begin?"     12/19/20 6. AGGRAVATING FACTORS: "Does anything make it worse?" (e.g., standing, change in head position)     Changing position of your head 7. HEART RATE: "Can you tell me your heart rate?" "How many beats in 15 seconds?"  (Note: not all patients can do this)       8. CAUSE: "What do you think is causing the dizziness?"    Not sure 9. RECURRENT SYMPTOM: "Have you had dizziness before?" If Yes, ask: "When was the last time?" "What happened that time?"     Yes, ? Told she had vertigo; pt has seizures 10. OTHER SYMPTOMS: "Do you have any other symptoms?" (e.g.,  fever, chest pain, vomiting, diarrhea, bleeding)      Intermittent headaches, says she had them "all of my life" 11. PREGNANCY: "Is there any chance you are pregnant?" "When was your last menstrual period?"       no  Protocols used: DIZZINESS [facility]

## 2021-01-29 ENCOUNTER — Other Ambulatory Visit: Payer: Self-pay

## 2021-01-29 ENCOUNTER — Ambulatory Visit: Payer: No Typology Code available for payment source | Attending: Physician Assistant | Admitting: Physician Assistant

## 2021-01-29 DIAGNOSIS — R42 Dizziness and giddiness: Secondary | ICD-10-CM | POA: Diagnosis not present

## 2021-01-29 NOTE — Telephone Encounter (Signed)
Will forward to provider  

## 2021-01-29 NOTE — Progress Notes (Signed)
Virtual Visit via Telephone Note  I connected with Marcelyn Ditty on 01/29/21 at  3:50 PM EDT by telephone and verified that I am speaking with the correct person using two identifiers.  Location: Patient: home Provider: [facility] office   I discussed the limitations, risks, security and privacy concerns of performing an evaluation and management service by telephone and the availability of in person appointments. I also discussed with the patient that there may be a patient responsible charge related to this service. The patient expressed understanding and agreed to proceed.   History of Present Illness:  Patient is c/o dizziness since her back surgery in February.  Occurs 1-2 times daily.  She has an appt with neurology 02/28/2021.  No LOC.  No head injury.  She is taking some pain meds.  No vision changes.  No seizure activity.  Complaint with meds.  Not drinking adequate water.  No N/V/D.  No h/o anemia.  No N/V/D/ or bowel changes.  No blood in stool.      Observations/Objective:  NAD.  A&Ox3   Assessment and Plan: 1. Dizziness To ED if worsens-she verbalizes understanding.  Drink 80-100 ounces water daily.  Her PCP has ordered a fair amount of bloodwork that she has not come and done yet and I will add the following- - Thyroid Panel With TSH; Future - CBC with Differential/Platelet; Future -also concern some of this could be medication induced-cautioned her with pain meds.      Follow Up Instructions: See Zelda for pap in 2 months   I discussed the assessment and treatment plan with the patient. The patient was provided an opportunity to ask questions and all were answered. The patient agreed with the plan and demonstrated an understanding of the instructions.   The patient was advised to call back or seek an in-person evaluation if the symptoms worsen or if the condition fails to improve as anticipated.  I provided 15 minutes of non-face-to-face time during this  encounter.   Freeman Caldron, PA-C  Patient ID: MAUDEAN HOFFMANN, female   DOB: 07-17-1966, 55 y.o.   MRN: [id number]

## 2021-02-13 ENCOUNTER — Other Ambulatory Visit: Payer: Self-pay | Admitting: Nurse Practitioner

## 2021-02-13 DIAGNOSIS — M5416 Radiculopathy, lumbar region: Secondary | ICD-10-CM

## 2021-02-28 ENCOUNTER — Institutional Professional Consult (permissible substitution): Payer: No Typology Code available for payment source | Admitting: Neurology

## 2021-04-28 DIAGNOSIS — M48062 Spinal stenosis, lumbar region with neurogenic claudication: Secondary | ICD-10-CM | POA: Diagnosis not present

## 2021-05-06 ENCOUNTER — Other Ambulatory Visit: Payer: Self-pay

## 2021-05-06 ENCOUNTER — Ambulatory Visit: Payer: No Typology Code available for payment source | Attending: Family Medicine | Admitting: Family Medicine

## 2021-05-06 VITALS — BP 106/68 | HR 76 | Ht 64.0 in

## 2021-05-06 DIAGNOSIS — B350 Tinea barbae and tinea capitis: Secondary | ICD-10-CM | POA: Diagnosis not present

## 2021-05-06 DIAGNOSIS — R21 Rash and other nonspecific skin eruption: Secondary | ICD-10-CM

## 2021-05-06 MED ORDER — TRIAMCINOLONE ACETONIDE 0.1 % EX CREA: 1.0000 "application " | TOPICAL_CREAM | Freq: Two times a day (BID) | CUTANEOUS | 1 refills | Status: AC

## 2021-05-06 MED ORDER — SELENIUM SULFIDE 2.25 % EX SHAM
1.0000 | MEDICATED_SHAMPOO | CUTANEOUS | 1 refills | Status: AC
Start: 2021-05-08 — End: ?

## 2021-05-06 MED ORDER — TERBINAFINE HCL 250 MG PO TABS: 250.0000 mg | ORAL_TABLET | Freq: Every day | ORAL | 0 refills | Status: AC

## 2021-05-06 NOTE — Progress Notes (Signed)
Pt has rash on neck and behind ears.

## 2021-05-06 NOTE — Progress Notes (Signed)
Subjective:  Patient ID: Holly Cook, female    DOB: 06-19-1966  Age: 55 y.o. MRN: [id number]  CC: Rash (/)   HPI Holly Cook is a 55 year old female patient of [name], FNP with a history of Seizures, Type 2 Diabetes Mellitus, Obstructive Sleep Apnea, here for an acute visit.  Interval History: She has a rash around her L ear; L ear 'runs sometimes'. It does not hurt but she has tinnitus. ENT had previously informed her she had ' a hole' in her L ear.  Noticed itching of her scalp after getting her hair done a couple of months ago which resulted in pus formation and sores. If she keeps her hair moist it is soft and if she does otherwise it is dry and flaky. This was followed by development of bald sots. When she itches and the flakes fall on her she develops rash in those regions. She also notices areas of patchy hairloss. OTC regimens have been ineffective Symptoms have been present for months  She also has a dry rash on her R arm and R chestwall and has been using OTC Hydrocortisone with no much improvement.  Past Medical History:  Diagnosis Date   CAD (coronary artery disease)    Diabetes mellitus without complication ([place])    patient states she was told this one but time but she doesn't have it   Sleep apnea    Stroke ([place]) 2019   "slight stroke"     Past Surgical History:  Procedure Laterality Date   BACK SURGERY     lumbar spine   HERNIA REPAIR     wrist and arm surgery      Family History  Problem Relation Age of Onset   Neuropathy Neg Hx     Allergies  Allergen Reactions   Tetanus Toxoids Other (See Comments)    Flu-like symptoms, injection site reaction; treated with antibiotics.  Not certain if reaction to vaccine or infection of injection site    Outpatient Medications Prior to Visit  Medication Sig Dispense Refill   albuterol (VENTOLIN HFA) 108 (90 Base) MCG/ACT inhaler Inhale 2 puffs into the lungs every 6 (six) hours as needed for wheezing  or shortness of breath. 8 g 2   aspirin EC 81 MG tablet Take 81 mg by mouth every other day. Swallow whole.     carvedilol (COREG) 25 MG tablet Take 12.5 mg by mouth 2 (two) times daily with a meal.     cyclobenzaprine (FLEXERIL) 10 MG tablet Take 1 tablet (10 mg total) by mouth 2 (two) times daily as needed for muscle spasms. 30 tablet 1   gabapentin (NEURONTIN) 300 MG capsule Take 300 mg by mouth at bedtime.     levETIRAcetam (KEPPRA) 500 MG tablet Take 500 mg by mouth in the morning, at noon, and at bedtime.     sertraline (ZOLOFT) 100 MG tablet Take 200 mg by mouth daily.     atorvastatin (LIPITOR) 20 MG tablet Take 1 tablet (20 mg total) by mouth daily at 6 PM. 90 tablet 3   montelukast (SINGULAIR) 10 MG tablet Take 1 tablet (10 mg total) by mouth at bedtime. 90 tablet 1   pantoprazole (PROTONIX) 40 MG tablet Take 1 tablet (40 mg total) by mouth daily. 90 tablet 1   traZODone (DESYREL) 100 MG tablet Take 0.5-1 tablets (50-100 mg total) by mouth at bedtime. 30 tablet 3   No facility-administered medications prior to visit.     ROS  Review of Systems  Constitutional:  Negative for activity change, appetite change and fatigue.  HENT:  Positive for tinnitus. Negative for congestion, sinus pressure and sore throat.   Eyes:  Negative for visual disturbance.  Respiratory:  Negative for cough, chest tightness, shortness of breath and wheezing.   Cardiovascular:  Negative for chest pain and palpitations.  Gastrointestinal:  Negative for abdominal distention, abdominal pain and constipation.  Endocrine: Negative for polydipsia.  Genitourinary:  Negative for dysuria and frequency.  Musculoskeletal:  Negative for arthralgias and back pain.  Skin:  Positive for rash.  Neurological:  Negative for tremors, light-headedness and numbness.  Hematological:  Does not bruise/bleed easily.  Psychiatric/Behavioral:  Negative for agitation and behavioral problems.    Objective:  BP 106/68   Pulse 76    Ht 5\' 4"  (1.626 m)   SpO2 95%   BMI 43.77 kg/m   BP/Weight 05/06/2021 12/21/2020 02/11/8785  Systolic BP 767 88 -  Diastolic BP 68 66 -  Wt. (Lbs) - - 255  BMI 43.77 - 43.77      Physical Exam Constitutional:      Appearance: She is well-developed.  HENT:     Right Ear: Tympanic membrane normal.     Left Ear: Tympanic membrane normal.  Neck:     Vascular: No JVD.  Cardiovascular:     Rate and Rhythm: Normal rate.     Heart sounds: Normal heart sounds. No murmur heard. Pulmonary:     Effort: Pulmonary effort is normal.     Breath sounds: Normal breath sounds. No wheezing or rales.  Chest:     Chest wall: No tenderness.  Abdominal:     General: Bowel sounds are normal. There is no distension.     Palpations: Abdomen is soft. There is no mass.     Tenderness: There is no abdominal tenderness.  Musculoskeletal:        General: Normal range of motion.     Right lower leg: No edema.     Left lower leg: No edema.  Skin:    Comments: Hypopigmented area anterior to L ear Areas of hairloss on scalp  Neurological:     Mental Status: She is alert and oriented to person, place, and time.  Psychiatric:        Mood and Affect: Mood normal.    CMP Latest Ref Rng & Units 12/19/2020 11/25/2020 03/22/2020  Glucose 70 - 99 mg/dL 94 138(H) 91  BUN 6 - 20 mg/dL 7 10 12   Creatinine 0.44 - 1.00 mg/dL 0.90 1.04(H) 1.11(H)  Sodium 135 - 145 mmol/L 140 140 144  Potassium 3.5 - 5.1 mmol/L 3.5 3.1(L) 4.2  Chloride 98 - 111 mmol/L 107 105 105  CO2 22 - 32 mmol/L 23 26 23   Calcium 8.9 - 10.3 mg/dL 9.3 8.8(L) 9.8  Total Protein 6.5 - 8.1 g/dL - 7.5 7.1  Total Bilirubin 0.3 - 1.2 mg/dL - 0.6 0.4  Alkaline Phos 38 - 126 U/L - 116 138(H)  AST 15 - 41 U/L - 66(H) 25  ALT 0 - 44 U/L - 49(H) 22    Lipid Panel     Component Value Date/Time   CHOL 239 (H) 04/23/2016 0215   TRIG 97 04/23/2016 0215   HDL 53 04/23/2016 0215   CHOLHDL 4.5 04/23/2016 0215   VLDL 19 04/23/2016 0215   LDLCALC 167  (H) 04/23/2016 0215    CBC    Component Value Date/Time   WBC 5.6 12/19/2020 1108  RBC 5.26 (H) 12/19/2020 1108   HGB 13.7 12/19/2020 1108   HCT 42.0 12/19/2020 1108   PLT 334 12/19/2020 1108   MCV 79.8 (L) 12/19/2020 1108   MCH 26.0 12/19/2020 1108   MCHC 32.6 12/19/2020 1108   RDW 14.7 12/19/2020 1108   LYMPHSABS 1.8 11/25/2020 0545   MONOABS 0.4 11/25/2020 0545   EOSABS 0.1 11/25/2020 0545   BASOSABS 0.0 11/25/2020 0545    Lab Results  Component Value Date   HGBA1C 5.6 12/20/2020    Assessment & Plan:  1. Tinea capitis Uncontrolled - Selenium Sulfide 2.25 % SHAM; Apply 1 application topically 2 (two) times a week.  Dispense: 180 mL; Refill: 1 - terbinafine (LAMISIL) 250 MG tablet; Take 1 tablet (250 mg total) by mouth daily.  Dispense: 28 tablet; Refill: 0 - Ambulatory referral to Dermatology  2. Rash and nonspecific skin eruption - triamcinolone cream (KENALOG) 0.1 %; Apply 1 application topically 2 (two) times daily.  Dispense: 30 g; Refill: 1 - Ambulatory referral to Dermatology   No orders of the defined types were placed in this encounter.   Follow-up: No follow-ups on file.       Charlott Rakes, MD, FAAFP. [facility] [place], [place]   05/06/2021, 10:56 AM

## 2021-06-05 ENCOUNTER — Ambulatory Visit (HOSPITAL_BASED_OUTPATIENT_CLINIC_OR_DEPARTMENT_OTHER): Payer: Medicare Other | Attending: Nurse Practitioner | Admitting: Internal Medicine

## 2021-06-05 ENCOUNTER — Other Ambulatory Visit: Payer: Self-pay

## 2021-06-05 VITALS — Ht 64.0 in | Wt 272.0 lb

## 2021-06-05 DIAGNOSIS — Z79899 Other long term (current) drug therapy: Secondary | ICD-10-CM | POA: Diagnosis not present

## 2021-06-05 DIAGNOSIS — R0683 Snoring: Secondary | ICD-10-CM | POA: Insufficient documentation

## 2021-06-05 DIAGNOSIS — G4733 Obstructive sleep apnea (adult) (pediatric): Secondary | ICD-10-CM | POA: Diagnosis present

## 2021-06-15 DIAGNOSIS — G4733 Obstructive sleep apnea (adult) (pediatric): Secondary | ICD-10-CM

## 2021-06-15 NOTE — Procedures (Signed)
    Patient Name: Holly Cook, Holly Cook Date: 06/05/2021 Gender: Female D.O.B: 09/01/1966 Age (years): 64 Referring Provider: Gildardo Pounds NP Height (inches): 2 Interpreting Physician: Baird Lyons MD, ABSM Weight (lbs): 272 RPSGT: Laren Everts BMI: 71 MRN: [id number]  CLINICAL INFORMATION Sleep Study Type: NPSG Indication for sleep study: Morning Headaches, Obesity, OSA, Sleep walking/talking/parasomnias, Snoring Epworth Sleepiness Score: 14  SLEEP STUDY TECHNIQUE As per the AASM Manual for the Scoring of Sleep and Associated Events v2.3 (April 2016) with a hypopnea requiring 4% desaturations.  The channels recorded and monitored were frontal, central and occipital EEG, electrooculogram (EOG), submentalis EMG (chin), nasal and oral airflow, thoracic and abdominal wall motion, anterior tibialis EMG, snore microphone, electrocardiogram, and pulse oximetry.  MEDICATIONS Medications self-administered by patient taken the night of the study : ATORVASTATIN, CARVEDILOL, CYCLOBENZAPRINE HCL, GABAPENTIN, LEVETIRACETAM, MONTELUKAST, PANTOPRAZOLE SODIUM, PSYLLIUM, TRAZODONE  SLEEP ARCHITECTURE The study was initiated at 11:08:10 PM and ended at 5:17:42 AM.  Sleep onset time was 49.4 minutes and the sleep efficiency was 78.7%%. The total sleep time was 291 minutes.  Stage REM latency was 143.5 minutes.  The patient spent 10.1%% of the night in stage N1 sleep, 75.3%% in stage N2 sleep, 0.0%% in stage N3 and 14.6% in REM.  Alpha intrusion was absent.  Supine sleep was 12.89%.  RESPIRATORY PARAMETERS The overall apnea/hypopnea index (AHI) was 3.3 per hour. There were 9 total apneas, including 8 obstructive, 1 central and 0 mixed apneas. There were 7 hypopneas and 29 RERAs.  The AHI during Stage REM sleep was 1.4 per hour.  AHI while supine was 11.2 per hour.  The mean oxygen saturation was 91.3%. The minimum SpO2 during sleep was 82.0%.  moderate snoring was noted during this  study.  CARDIAC DATA The 2 lead EKG demonstrated sinus rhythm. The mean heart rate was 69.1 beats per minute. Other EKG findings include: None.  LEG MOVEMENT DATA The total PLMS were 0 with a resulting PLMS index of 0.0. Associated arousal with leg movement index was 0.0 .  IMPRESSIONS - No significant obstructive sleep apnea occurred during this study (AHI = 3.3/h). - No significant central sleep apnea occurred during this study (CAI = 0.2/h). - Mild oxygen desaturation was noted during this study (Min O2 = 82.0%). Mean O2 saturation 91.7%. - The patient snored with moderate snoring volume. - No cardiac abnormalities were noted during this study. - Clinically significant periodic limb movements did not occur during sleep. No significant associated arousals. - Patient slept on wedge due to back problems. No significant movement disorder identified.  DIAGNOSIS - Primary snoring  RECOMMENDATIONS - Manage for symptoms and snoring based on clinical judgment. - Sleep hygiene should be reviewed to assess factors that may improve sleep quality. - Weight management and regular exercise should be initiated or continued if appropriate.  [Electronically signed] 06/15/2021 10:41 AM  Baird Lyons MD, ABSM Diplomate, American Board of Sleep Medicine   NPI: [id number]                         [place]  ELECTRONICALLY SIGNED ON:  06/15/2021, 10:38 AM [place] PH: [phone]   FX: [phone] [place]

## 2021-06-23 ENCOUNTER — Telehealth: Payer: Self-pay | Admitting: *Deleted

## 2021-06-23 NOTE — Telephone Encounter (Signed)
Copied from [place] [phone]. Topic: General - Other >> Jun 18, 2021  3:47 PM Leward Quan A wrote: Reason for CRM: Patient called in asking to speak to doctor Wynetta Emery or nurse about her sleep study results as soon as they are in. Can be reached at Ph# [phone]

## 2021-06-23 NOTE — Telephone Encounter (Signed)
Sleep study did not show sleep apnea only snoring.

## 2021-06-23 NOTE — Telephone Encounter (Signed)
Copied from [place] [phone]. Topic: General - Other >> Jun 18, 2021  3:47 PM Leward Quan A wrote: Reason for CRM: Patient called in asking to speak to doctor Wynetta Emery or nurse about her sleep study results as soon as they are in. Can be reached at Ph# [phone]

## 2021-06-24 NOTE — Telephone Encounter (Signed)
Pt was called and VM was left informing patient to return phone call.  Please give patient results when she return call.

## 2021-06-24 NOTE — Telephone Encounter (Signed)
Returned cal unable to reach pt at this time

## 2021-06-24 NOTE — Telephone Encounter (Signed)
Pt returned call, [phone]. Please advise if PEC may disclose.

## 2021-07-10 ENCOUNTER — Telehealth: Payer: Self-pay | Admitting: Nurse Practitioner

## 2021-07-10 ENCOUNTER — Telehealth (INDEPENDENT_AMBULATORY_CARE_PROVIDER_SITE_OTHER): Payer: Self-pay | Admitting: Nurse Practitioner

## 2021-07-10 NOTE — Telephone Encounter (Signed)
Copied from [place] [id number]. Topic: General - Other >> Jul 09, 2021  4:47 PM Tessa Lerner A wrote: Reason for CRM: Patient would like to be contacted regarding their sleep study results  The patient would like to discuss these results further when possible >> Jul 09, 2021  4:49 PM Lenon Curt, Everette A wrote: Patient has stressed the urgency of the call. Their previous CPAP machine is less than ideal.

## 2021-07-10 NOTE — Telephone Encounter (Signed)
Copied from [place] [id number]. Topic: General - Other >> Jul 09, 2021  4:47 PM Tessa Lerner A wrote: Reason for CRM: Patient would like to be contacted regarding their sleep study results  The patient would like to discuss these results further when possible >> Jul 09, 2021  4:49 PM Lenon Curt, Everette A wrote: Patient has stressed the urgency of the call. Their previous CPAP machine is less than ideal.

## 2021-07-13 NOTE — Telephone Encounter (Signed)
Her sleep study did not show sleep apnea. She does not need a CPAP. If she still wants to discuss please schedule her for a tele visit [phone] or 130

## 2021-07-15 NOTE — Telephone Encounter (Signed)
Left message on voicemail for patient to return call.   Will relay message per PCP Raul Del regarding her sleep study.

## 2021-07-15 NOTE — Telephone Encounter (Signed)
Pt requested a call back, please advise.  

## 2021-07-16 NOTE — Telephone Encounter (Signed)
Note   Her sleep study did not show sleep apnea. She does not need a CPAP. If she still wants to discuss please schedule her for a tele visit [phone] or 130    Please see note that was sent on September 11th.

## 2021-07-17 NOTE — Telephone Encounter (Signed)
Patient aware of message. Does not wish to discuss with PCP at this time.

## 2022-03-26 ENCOUNTER — Encounter: Payer: Self-pay | Admitting: Neurology

## 2022-04-08 DIAGNOSIS — R0602 Shortness of breath: Secondary | ICD-10-CM | POA: Diagnosis not present

## 2022-04-08 DIAGNOSIS — M549 Dorsalgia, unspecified: Secondary | ICD-10-CM | POA: Diagnosis not present

## 2022-04-08 DIAGNOSIS — J45909 Unspecified asthma, uncomplicated: Secondary | ICD-10-CM | POA: Diagnosis not present

## 2022-04-08 DIAGNOSIS — N201 Calculus of ureter: Secondary | ICD-10-CM | POA: Diagnosis not present

## 2022-04-08 DIAGNOSIS — R10813 Right lower quadrant abdominal tenderness: Secondary | ICD-10-CM | POA: Diagnosis not present

## 2022-04-08 DIAGNOSIS — N132 Hydronephrosis with renal and ureteral calculous obstruction: Secondary | ICD-10-CM | POA: Diagnosis not present

## 2022-04-08 DIAGNOSIS — I2510 Atherosclerotic heart disease of native coronary artery without angina pectoris: Secondary | ICD-10-CM | POA: Diagnosis not present

## 2022-04-08 DIAGNOSIS — R110 Nausea: Secondary | ICD-10-CM | POA: Diagnosis not present

## 2022-04-08 DIAGNOSIS — I10 Essential (primary) hypertension: Secondary | ICD-10-CM | POA: Diagnosis not present

## 2022-04-08 DIAGNOSIS — G4730 Sleep apnea, unspecified: Secondary | ICD-10-CM | POA: Diagnosis not present

## 2022-04-08 DIAGNOSIS — K219 Gastro-esophageal reflux disease without esophagitis: Secondary | ICD-10-CM | POA: Diagnosis not present

## 2022-04-08 DIAGNOSIS — K5790 Diverticulosis of intestine, part unspecified, without perforation or abscess without bleeding: Secondary | ICD-10-CM | POA: Diagnosis not present

## 2022-04-08 DIAGNOSIS — K5732 Diverticulitis of large intestine without perforation or abscess without bleeding: Secondary | ICD-10-CM | POA: Diagnosis not present

## 2022-04-08 DIAGNOSIS — Z8669 Personal history of other diseases of the nervous system and sense organs: Secondary | ICD-10-CM | POA: Diagnosis not present

## 2022-04-08 DIAGNOSIS — E119 Type 2 diabetes mellitus without complications: Secondary | ICD-10-CM | POA: Diagnosis not present

## 2022-04-08 DIAGNOSIS — R1032 Left lower quadrant pain: Secondary | ICD-10-CM | POA: Diagnosis not present

## 2022-04-08 DIAGNOSIS — E785 Hyperlipidemia, unspecified: Secondary | ICD-10-CM | POA: Diagnosis not present

## 2022-04-08 DIAGNOSIS — Z8719 Personal history of other diseases of the digestive system: Secondary | ICD-10-CM | POA: Diagnosis not present

## 2022-04-08 DIAGNOSIS — K5792 Diverticulitis of intestine, part unspecified, without perforation or abscess without bleeding: Secondary | ICD-10-CM | POA: Diagnosis not present

## 2022-06-29 ENCOUNTER — Ambulatory Visit: Payer: Self-pay | Admitting: *Deleted

## 2022-06-29 NOTE — Telephone Encounter (Signed)
  Chief Complaint: Chest tightness, irregular HR Symptoms: States HR fast and irregular last night, now with "Squeezing left chest under breast. States "I've had heart problems before but nothing like this." SOB.  Frequency: Last night Pertinent Negatives: Patient denies  Disposition: '[x]'$ ED /'[]'$ Urgent Care (no appt availability in office) / '[]'$ Appointment(In office/virtual)/ '[]'$  [facility]/ '[]'$ Home Care/ '[]'$ Refused Recommended Disposition /'[]'$ [facility]/ '[]'$  Follow-up with PCP Additional Notes: Advised ED, offered EMS,declined states has driver, will go now. Reason for Disposition  [1] Chest pain lasts > 5 minutes AND [2] occurred in past 3 days (72 hours) (Exception: Feels exactly the same as previously diagnosed heartburn and has accompanying sour taste in mouth.)  Answer Assessment - Initial Assessment Questions 1. LOCATION: "Where does it hurt?"       Left breast area, under. Tightness 2. RADIATION: "Does the pain go anywhere else?" (e.g., into neck, jaw, arms, back)     No  3. ONSET: "When did the chest pain begin?" (Minutes, hours or days)       4. PATTERN: "Does the pain come and go, or has it been constant since it started?"  "Does it get worse with exertion?"       5. DURATION: "How long does it last" (e.g., seconds, minutes, hours)      6. SEVERITY: "How bad is the pain?"  (e.g., Scale 1-10; mild, moderate, or severe)    - MILD (1-3): doesn't interfere with normal activities     - MODERATE (4-7): interferes with normal activities or awakens from sleep    - SEVERE (8-10): excruciating pain, unable to do any normal activities       ""Squeezing" 7. CARDIAC RISK FACTORS: "Do you have any history of heart problems or risk factors for heart disease?" (e.g., angina, prior heart attack; diabetes, high blood pressure, high cholesterol, smoker, or strong family history of heart disease)      8. PULMONARY RISK FACTORS: "Do you have any history of lung disease?"  (e.g.,  blood clots in lung, asthma, emphysema, birth control pills)      9. CAUSE: "What do you think is causing the chest pain?"      10. OTHER SYMPTOMS: "Do you have any other symptoms?" (e.g., dizziness, nausea, vomiting, sweating, fever, difficulty breathing, cough)       HR fast, irregular, woke her up last night, SOB, chest left sided "Squeezing."  Protocols used: Chest Pain-A-AH

## 2022-06-29 NOTE — Progress Notes (Unsigned)
Initial neurology clinic note  SERVICE DATE: 07/01/22 SERVICE TIME: 1:00 pm  Reason for Evaluation: Consultation requested by Posey Pronto for an opinion regarding neuropathy in feet. My final recommendations will be communicated back to the requesting physician by way of shared medical record or letter to requesting physician via [place] mail.  HPI: This is [name], a 56 y.o. right-handed female with a medical history of seizures, HTN, CHF, ?DM2, OSA, lumbosacral radiculopathy, depression who presents to neurology clinic with the chief complaint of numbness in feet. The patient is alone today.  Patient has had numbness and tingling in her feet for a couple of years. It has been stable (not getting better or worse). She describes tingling and pulling sensation in her feet and then numbness. She does endorse back pain. The pain averages about 8/10. She finds it difficult to wear shoes and has to buy bigger shoes than normal. At first things were really bad at night, then was all day, and now it is near constant.  She endorses cramping in both legs. She thinks her legs are weak as well as they sometimes give out. She has occasional falls, last 2 weeks ago. Her falls are not as many as previous, only a couple of times in the past year. She feels like if she walks more, her feet hurt and swell.   She has done therapy, but more than a year ago.  She endorses difficulty with swallowing both solids and liquids. She endorses frequent choking. She endorses OSA and difficulty breathing at night, but suggests this is due to her CPAP being recalled.  Patient has previously been seen at the [place]. Per primary care documentation from 12/17/21 by Audrie Lia, patient has a history of lumbar spinal stenosis and disc herniation for which she follows with NSGY. She had a laminectomy on 12/2020 with some improvement but still has intermittent neuropathy in her feet. She was referred to neurology  for this.  Of note, patient was seen by [facility] (Dr Jaynee Eagles) on 03/20/20 for numbness and tingling in both feet. Per documentation, patient had been previously evaluated by other neurologists, including with NCS and EMG that were normal.  Patient was also seen by a neurologist in [place] last week per patient. Patient does not know what was done or what they were thinking. She reports an EMG was done last week. I do not have the records from clinic or EMG. She said they did a lot of tests but she is not aware of results.  Relevant/selective medications include gabapentin '400mg'$  qhs for back pain (does not help), Keppra '500mg'$  TID for seizures (no recent seizures), sertraline '200mg'$  qd for depression, trazodone '100mg'$  qhs for sleep. She has previously tried a cream she found online that did not help (she does not remember what it was)  EtOH use: No  Restrictive diet? No Family history of neuropathy/myopathy/NM disease? No, but sister had MS  Patient's goal is to be able to walk better.  MEDICATIONS:  Outpatient Encounter Medications as of 07/01/2022  Medication Sig   albuterol (VENTOLIN HFA) 108 (90 Base) MCG/ACT inhaler Inhale 2 puffs into the lungs every 6 (six) hours as needed for wheezing or shortness of breath.   aspirin EC 81 MG tablet Take 81 mg by mouth every other day. Swallow whole.   atorvastatin (LIPITOR) 20 MG tablet Take 1 tablet (20 mg total) by mouth daily at 6 PM.   carvedilol (COREG) 25 MG tablet Take 12.5 mg  by mouth 2 (two) times daily with a meal.   cyclobenzaprine (FLEXERIL) 10 MG tablet Take 1 tablet (10 mg total) by mouth 2 (two) times daily as needed for muscle spasms.   gabapentin (NEURONTIN) 300 MG capsule Take 300 mg by mouth at bedtime.   levETIRAcetam (KEPPRA) 500 MG tablet Take 500 mg by mouth in the morning, at noon, and at bedtime.   montelukast (SINGULAIR) 10 MG tablet Take 1 tablet (10 mg total) by mouth at bedtime.   pantoprazole (PROTONIX) 40 MG  tablet Take 1 tablet (40 mg total) by mouth daily.   Selenium Sulfide 2.25 % SHAM Apply 1 application topically 2 (two) times a week.   sertraline (ZOLOFT) 100 MG tablet Take 200 mg by mouth daily.   terbinafine (LAMISIL) 250 MG tablet Take 1 tablet (250 mg total) by mouth daily.   traZODone (DESYREL) 100 MG tablet Take 0.5-1 tablets (50-100 mg total) by mouth at bedtime.   triamcinolone cream (KENALOG) 0.1 % Apply 1 application topically 2 (two) times daily.   No facility-administered encounter medications on file as of 07/01/2022.    PAST MEDICAL HISTORY: Past Medical History:  Diagnosis Date   CAD (coronary artery disease)    Diabetes mellitus without complication ([place])    patient states she was told this one but time but she doesn't have it   Sleep apnea    Stroke ([place]) 2019   "slight stroke"     PAST SURGICAL HISTORY: Past Surgical History:  Procedure Laterality Date   BACK SURGERY     lumbar spine   HERNIA REPAIR     wrist and arm surgery      ALLERGIES: Allergies  Allergen Reactions   Tetanus Toxoids Other (See Comments)    Flu-like symptoms, injection site reaction; treated with antibiotics.  Not certain if reaction to vaccine or infection of injection site    FAMILY HISTORY: Family History  Problem Relation Age of Onset   Cancer Mother    Cancer Father    Multiple sclerosis Sister    Neuropathy Neg Hx     SOCIAL HISTORY: Social History   Tobacco Use   Smoking status: Never   Smokeless tobacco: Never  Vaping Use   Vaping Use: Never used  Substance Use Topics   Alcohol use: No   Drug use: No   Social History   Social History Narrative   Lives in independent living at [facility]    Right handed   Caffeine: maybe 2-3 cups daily     OBJECTIVE: PHYSICAL EXAM: Ht '5\' 4"'$  (1.626 m)   BMI 46.35 kg/m   General: General appearance: Awake and alert. No distress. Cooperative with exam.  Skin: No obvious rash or jaundice. HEENT: Atraumatic.  Anicteric. Extremities: Mild peripheral edema in bilateral lower extremities. No obvious deformity.  Psych: Flat affect  Neurological: Mental Status: Alert. Speech fluent. No pseudobulbar affect Cranial Nerves: CNII: No RAPD. Visual fields intact. CNIII, IV, VI: PERRL. No nystagmus. EOMI. CN V: Facial sensation intact bilaterally to fine touch. Masseter clench strong. CN VII: Facial muscles symmetric and strong. No ptosis at rest. CN VIII: Hears finger rub well bilaterally. CN IX: No hypophonia. CN X: Palate elevates symmetrically. CN XI: Full strength shoulder shrug bilaterally. CN XII: Tongue protrusion full and midline. No atrophy or fasciculations. No significant dysarthria Motor: Tone is normal. No atrophy.  Individual muscle group testing (MRC grade out of 5):  Movement     Neck flexion 5  Neck extension 5     Right Left   Shoulder abduction 5 5   Shoulder adduction 5 5   Shoulder ext rotation 5 5   Shoulder int rotation 5 5   Elbow flexion 5 5   Elbow extension 5 5   Finger abduction - FDI 5 5   Finger abduction - ADM 5 5   Finger extension 5 5   Finger distal flexion - 2/'3 5 5   '$ Finger distal flexion - 4/'5 5 5   '$ Thumb flexion - FPL 5 5   Thumb abduction - APB 5- 5-    Hip flexion 5 5   Hip extension 5 5   Hip adduction 5 5   Hip abduction 5 5   Knee extension 5 5   Knee flexion 5 5   Dorsiflexion 5 5   Plantarflexion 5 5     Reflexes:  Right Left   Bicep 1+ 1+   Tricep 1+ 1+   BrRad 1+ 1+   Knee 1+ 1+   Ankle 0 0    Pathological Reflexes: Babinski: withdrawal response bilaterally Hoffman: absent bilaterally Troemner: absent bilaterally Sensation: Pinprick: Intact in all extremities Vibration: Intact in upper extremities, diminished to 10 seconds at bilateral great toes Temperature: Intact in all extremities Proprioception: Intact in bilateral great toes Coordination: Intact finger-to- nose-finger bilaterally. Romberg negative. Gait: Able  to rise from chair with arms crossed unassisted but with some difficulty. Stooped over when walking, antalgic gait. Normal based.  Lab and Test Review: Internal labs: TSH (06/30/22): 3.298 BMP with elevated glucose (117) and Cr 1.08 CBC and Mg unremarkable Vit D (03/22/20): 27.4 (low) B12 (03/20/20): 310 MMA (03/20/20): 278 (wnl)  External labs: 06/05/21 labs: ZLD3T: 5.9 Metabolic panel and CBC unremarkable  Lumbar spine xray (12/19/20): FINDINGS: PA and lateral view intraoperative fluoroscopic images of the lumbar spine are submitted, 2 images total. These images demonstrate new bilateral pedicle screws at the L3 level. Redemonstrated posterior spinal fusion construct (bilateral pedicle screws and vertical interconnecting rods) at the L4-L5 level. Overlying retractors.   IMPRESSION: Two intraoperative fluoroscopic images of the lumbar spine, as described.   CT cervical spine wo contrast (09/23/18): IMPRESSION: No bony abnormality in the cervical spine.  MRI/MRA brain (04/22/16): FINDINGS: MRI HEAD FINDINGS   Cerebral volume normal.  No significant white matter disease.   No abnormal foci of restricted diffusion to suggest acute infarct. Gray-white matter differentiation maintained. Major intracranial vascular flow voids are preserved. No acute or chronic intracranial hemorrhage. No areas of chronic infarction.   No mass lesion, midline shift, or mass effect. No hydrocephalus. No extra-axial fluid collection. Major dural sinuses are grossly patent.   Craniocervical junction normal. Visualized upper cervical spine unremarkable.   Pituitary gland normal. No acute abnormality about the globes and orbits.   Paranasal sinuses are clear. No mastoid effusion. Inner ear structures grossly normal.   Bone marrow signal intensity within normal limits. No acute scalp soft tissue abnormality. Small lipoma noted at the left frontal scalp.   MRA HEAD FINDINGS   ANTERIOR  CIRCULATION:   Visualized distal cervical segments of the internal carotid arteries are patent with antegrade flow. Petrous, cavernous, and supraclinoid segments widely patent. A1 segments, anterior communicating artery, and anterior cerebral arteries well opacified. M1 segments patent without stenosis or occlusion. MCA bifurcations normal. Distal MCA branches well opacified and symmetric.   POSTERIOR CIRCULATION:   Vertebral arteries patent to the vertebrobasilar junction. Posterior inferior cerebral arteries patent. Basilar artery  widely patent. Superior cerebellar arteries patent bilaterally. Both of the posterior cerebral arteries arise in the basilar artery and are well opacified to their distal aspects. Small right posterior communicating artery noted.   No aneurysm or vascular malformation.   IMPRESSION: 1. Normal brain MRI for patient age. No acute intracranial infarct or other process identified. 2. Normal intracranial MRA.  Date: 01/04/2019 ([facility]ose[facility]) NCV & EMG Findings: Evaluation of the left fibular motor and the right fibular motor nerves showed reduced amplitude (L0.8, R0.4 mV). The left superficial fibular sensory nerve showed no response (14 cm). All remaining nerves (as indicated in the following tables) were within normal limits. Left vs. Right side comparison data for the fibular motor nerve indicates abnormal L-R velocity difference (Poplt-B Fib, 25 m/s).   EMG: See table  Impression:  1. Chronic bilateral L4, L5 and S1 radiculopathies. No active denervation was seen. 2. No evidence of diffuse peripheral neuropathy or myopathy. 3. Low fibular CMAP amplitudes bilaterally are due to radiculopathy and/or degenerative changes in the feet. There is no evidence of entrapment at the fibular head.  _____________________________ Chales Salmon, M.D. Board Certified in [place]: DONNA SNOOKS is a 56 y.o. female who presents for  evaluation of numbness in bilateral feet. She has a relevant medical history of seizures, HTN, CHF, ?DM2, OSA, lumbosacral radiculopathy, depression. Patient endorses a pan-positive review of symptoms today. Her neurological examination is pertinent for reduced sensation in bilateral feet to vibration, hyporeflexia, and antalgic gait.   Prior EMGs have had conflicting data. A reported EMG from [facility] was normal and EMG from [facility] was read a radiculopathy, though reading the data seemed more consistent with polyneuropathy to me. She was reportedly evaluated by another neurologist in [place] yesterday and perhaps had another EMG, though these reports are not available to me today.  Overall, symptoms seem most consistent with a distal symmetric polyneuropathy, likely secondary to abnormal glucose (prediabetes vs diabetes). I will evaluate for other treatable causes of neuropathy and get records from [facility] neurologist. We discussed that while there are treatments for tingling/burning pain, numbness has no treatments.  PLAN: -Blood work: B12, HbA1c, IFE, SPEP -Get records: Signed a release of records: Phillips Odor, MD on 06/25/22 250[address] Dr Linna Hoff, [place] -Consider EMG pending prior work up -Continue current neuropathic medications for now (gabapentin 400 mg qhs)  -Return to clinic in 6 months if she would like to continue seeing me (versus one of her other recent neurologists)  The impression above as well as the plan as outlined below were extensively discussed with the patient who voiced understanding. All questions were answered to their satisfaction.  When available, results of the above investigations and possible further recommendations will be communicated to the patient via telephone/MyChart. Patient to call office if not contacted after expected testing turnaround time.   Total time spent reviewing records, interview, history/exam, documentation, and  coordination of care on day of encounter:  60 min   Thank you for allowing me to participate in patient's care.  If I can answer any additional questions, I would be pleased to do so.  Kai Levins, MD   CC: Gildardo Pounds, NP [address] [place] [address]  CC: Referring provider: Posey Pronto, PA-C [address] [place],  [place] [address]

## 2022-06-30 ENCOUNTER — Ambulatory Visit (HOSPITAL_COMMUNITY)
Admission: EM | Admit: 2022-06-30 | Discharge: 2022-06-30 | Disposition: A | Discharge status: Home / Self Care | Payer: No Typology Code available for payment source | Attending: Internal Medicine | Admitting: Internal Medicine

## 2022-06-30 ENCOUNTER — Encounter (HOSPITAL_COMMUNITY): Payer: Self-pay

## 2022-06-30 ENCOUNTER — Emergency Department (HOSPITAL_COMMUNITY): Payer: No Typology Code available for payment source

## 2022-06-30 ENCOUNTER — Emergency Department (HOSPITAL_COMMUNITY)
Admission: EM | Admit: 2022-06-30 | Discharge: 2022-06-30 | Disposition: A | Discharge status: Home / Self Care | Payer: No Typology Code available for payment source | Attending: Emergency Medicine | Admitting: Emergency Medicine

## 2022-06-30 DIAGNOSIS — R079 Chest pain, unspecified: Secondary | ICD-10-CM

## 2022-06-30 DIAGNOSIS — E119 Type 2 diabetes mellitus without complications: Secondary | ICD-10-CM | POA: Insufficient documentation

## 2022-06-30 DIAGNOSIS — Z7982 Long term (current) use of aspirin: Secondary | ICD-10-CM | POA: Insufficient documentation

## 2022-06-30 DIAGNOSIS — R42 Dizziness and giddiness: Secondary | ICD-10-CM | POA: Diagnosis not present

## 2022-06-30 DIAGNOSIS — R002 Palpitations: Secondary | ICD-10-CM | POA: Insufficient documentation

## 2022-06-30 DIAGNOSIS — I2510 Atherosclerotic heart disease of native coronary artery without angina pectoris: Secondary | ICD-10-CM | POA: Diagnosis not present

## 2022-06-30 DIAGNOSIS — R531 Weakness: Secondary | ICD-10-CM | POA: Insufficient documentation

## 2022-06-30 LAB — CBC WITH DIFFERENTIAL/PLATELET
Abs Immature Granulocytes: 0.02 10*3/uL (ref 0.00–0.07)
Basophils Absolute: 0 10*3/uL (ref 0.0–0.1)
Basophils Relative: 0 %
Eosinophils Absolute: 0.2 10*3/uL (ref 0.0–0.5)
Eosinophils Relative: 3 %
HCT: 38.8 % (ref 36.0–46.0)
Hemoglobin: 13.3 g/dL (ref 12.0–15.0)
Immature Granulocytes: 0 %
Lymphocytes Relative: 25 %
Lymphs Abs: 1.7 10*3/uL (ref 0.7–4.0)
MCH: 27 pg (ref 26.0–34.0)
MCHC: 34.3 g/dL (ref 30.0–36.0)
MCV: 78.7 fL — ABNORMAL LOW (ref 80.0–100.0)
Monocytes Absolute: 0.4 10*3/uL (ref 0.1–1.0)
Monocytes Relative: 5 %
Neutro Abs: 4.5 10*3/uL (ref 1.7–7.7)
Neutrophils Relative %: 67 %
Platelets: 328 10*3/uL (ref 150–400)
RBC: 4.93 MIL/uL (ref 3.87–5.11)
RDW: 14.6 % (ref 11.5–15.5)
WBC: 6.8 10*3/uL (ref 4.0–10.5)
nRBC: 0 % (ref 0.0–0.2)

## 2022-06-30 LAB — TSH: TSH: 3.298 u[IU]/mL (ref 0.350–4.500)

## 2022-06-30 LAB — BASIC METABOLIC PANEL
Anion gap: 6 (ref 5–15)
BUN: 10 mg/dL (ref 6–20)
CO2: 24 mmol/L (ref 22–32)
Calcium: 9.3 mg/dL (ref 8.9–10.3)
Chloride: 109 mmol/L (ref 98–111)
Creatinine, Ser: 1.08 mg/dL — ABNORMAL HIGH (ref 0.44–1.00)
GFR, Estimated: 60 mL/min — ABNORMAL LOW (ref >60)
Glucose, Bld: 117 mg/dL — ABNORMAL HIGH (ref 70–99)
Potassium: 3.9 mmol/L (ref 3.5–5.1)
Sodium: 139 mmol/L (ref 135–145)

## 2022-06-30 LAB — TROPONIN I (HIGH SENSITIVITY): Troponin I (High Sensitivity): 3 ng/L (ref <18)

## 2022-06-30 LAB — MAGNESIUM: Magnesium: 2 mg/dL (ref 1.7–2.4)

## 2022-06-30 NOTE — ED Provider Notes (Signed)
Patient presents with left-sided chest pain, tachycardia and palpitations beginning 2 days ago.  Symptoms occur are occurring early in the morning while sleeping resolving after she fully wakes up, unsure of exact timeframe of present.  Associated shortness of breath, dizziness, lightheadedness.  Pain is described as mild and feels as if something is sitting on her chest, rated a 5 out of 10.  History of diabetes mellitus, CAD, stroke.  Endorses a heart murmur.  Based on presentation and history patient is being sent to the nearest emergency department for further evaluation and work-up, EKG showing normal sinus rhythm and vital signs are stable therefore patient has been escorted by nursing staff to the ED .    Hans Eden, NP 06/30/22 1025

## 2022-06-30 NOTE — Discharge Instructions (Signed)
Your work-up today was reassuring.  Please follow-up with cardiologist.  Have given you some information to review (labeled "palpitations" and attached to the back of this packet.

## 2022-06-30 NOTE — ED Triage Notes (Signed)
Pt. Stated, I started having palpitations on Sunday and then it sorted made my chest hurt also and it just kep on til it was making me tired and weak.

## 2022-06-30 NOTE — ED Provider Triage Note (Signed)
Emergency Medicine Provider Triage Evaluation Note  Holly Cook , a 56 y.o. female  was evaluated in triage.  Pt complains of palpitations and chest pain.  Patient reports that she has been waking up with a heart racing sensation associated with some chest pressure and shortness of breath.  She sometimes feels lightheaded with this but has not had syncopal episodes.  Reports that this has occurred intermittently for the past year but has been occurring more frequently over the past few days..  Review of Systems  Positive: Chest pain, palpitations, shortness of breath, lightheadedness Negative: Abdominal pain, vomiting, syncope  Physical Exam  BP 134/84 (BP Location: Right Arm)   Pulse 79   Temp 98.6 F (37 C) (Oral)   Resp 16   SpO2 98%  Gen:   Awake, no distress   Resp:  Normal effort, CTA bilateral, RRR MSK:   Moves extremities without difficulty  Other:    Medical Decision Making  Medically screening exam initiated at 10:50 AM.  Appropriate orders placed.  Holly Cook was informed that the remainder of the evaluation will be completed by another provider, this initial triage assessment does not replace that evaluation, and the importance of remaining in the ED until their evaluation is complete.  EKG upon arrival shows normal sinus rhythm without ischemic changes.  Work-up initiated for chest pain and palpitations   Holly Cook, [place] 06/30/22 1058

## 2022-06-30 NOTE — ED Notes (Signed)
Patient is being discharged from the Urgent Care and sent to the Emergency Department via personal vehicle . Per Lowella Petties, NP, patient is in need of higher level of care due to chest pain. Patient is aware and verbalizes understanding of plan of care.  Vitals:   06/30/22 1008  BP: 125/84  Pulse: 87  Resp: 16  Temp: 99.2 F (37.3 C)  SpO2: 98%

## 2022-06-30 NOTE — ED Notes (Signed)
Pt verbalizes understanding of discharge instructions. Opportunity for questions and answers were provided. Pt discharged from the ED.   ?

## 2022-06-30 NOTE — ED Triage Notes (Signed)
Patient having chest pain and rapid heart rate. Onset night before last off and on. Left side chest pain. States the pain is mild, states it feels like something is sitting on her chest and is SOB.  Patient has heart history of CAD. Patient has not passed out but has felt like she would.

## 2022-06-30 NOTE — ED Provider Notes (Signed)
[facility] EMERGENCY DEPARTMENT Provider Note   CSN: [id number] Arrival date & time: 06/30/22  1024     History {Add pertinent medical, surgical, social history, OB history to HPI:1} Chief Complaint  Patient presents with  . Chest Pain  . Palpitations  . Weakness    Holly Cook is a 56 y.o. female.   Chest Pain Associated symptoms: palpitations and weakness   Palpitations Associated symptoms: chest pain and weakness   Weakness Associated symptoms: chest pain           Home Medications Prior to Admission medications   Medication Sig Start Date End Date Taking? Authorizing Provider  albuterol (VENTOLIN HFA) 108 (90 Base) MCG/ACT inhaler Inhale 2 puffs into the lungs every 6 (six) hours as needed for wheezing or shortness of breath. 12/31/20   Gildardo Pounds, NP  aspirin EC 81 MG tablet Take 81 mg by mouth every other day. Swallow whole.    [provider]  atorvastatin (LIPITOR) 20 MG tablet Take 1 tablet (20 mg total) by mouth daily at 6 PM. 12/31/20 03/31/21  Gildardo Pounds, NP  carvedilol (COREG) 25 MG tablet Take 12.5 mg by mouth 2 (two) times daily with a meal.    [provider]  cyclobenzaprine (FLEXERIL) 10 MG tablet Take 1 tablet (10 mg total) by mouth 2 (two) times daily as needed for muscle spasms. 03/29/20   Gildardo Pounds, NP  gabapentin (NEURONTIN) 300 MG capsule Take 300 mg by mouth at bedtime.    [provider]  levETIRAcetam (KEPPRA) 500 MG tablet Take 500 mg by mouth in the morning, at noon, and at bedtime.    [provider]  montelukast (SINGULAIR) 10 MG tablet Take 1 tablet (10 mg total) by mouth at bedtime. 12/31/20 03/31/21  Gildardo Pounds, NP  pantoprazole (PROTONIX) 40 MG tablet Take 1 tablet (40 mg total) by mouth daily. 12/31/20 03/31/21  Gildardo Pounds, NP  Selenium Sulfide 2.25 % SHAM Apply 1 application topically 2 (two) times a week. 05/08/21   Charlott Rakes, MD  sertraline (ZOLOFT) 100  MG tablet Take 200 mg by mouth daily.    [provider]  terbinafine (LAMISIL) 250 MG tablet Take 1 tablet (250 mg total) by mouth daily. 05/06/21   Charlott Rakes, MD  traZODone (DESYREL) 100 MG tablet Take 0.5-1 tablets (50-100 mg total) by mouth at bedtime. 12/31/20 01/30/21  Gildardo Pounds, NP  triamcinolone cream (KENALOG) 0.1 % Apply 1 application topically 2 (two) times daily. 05/06/21   Charlott Rakes, MD      Allergies    Tetanus toxoids    Review of Systems   Review of Systems  Cardiovascular:  Positive for chest pain and palpitations.  Neurological:  Positive for weakness.    Physical Exam Updated Vital Signs BP 132/76   Pulse 78   Temp 98.6 F (37 C) (Oral)   Resp 20   Ht '5\' 4"'$  (1.626 m)   Wt 122.5 kg   SpO2 98%   BMI 46.35 kg/m  Physical Exam  ED Results / Procedures / Treatments   Labs (all labs ordered are listed, but only abnormal results are displayed) Labs Reviewed  CBC WITH DIFFERENTIAL/PLATELET - Abnormal; Notable for the following components:      Result Value   MCV 78.7 (*)    All other components within normal limits  BASIC METABOLIC PANEL  MAGNESIUM  TSH  TROPONIN I (HIGH SENSITIVITY)    EKG  None  Radiology DG Chest 2 View  Result Date: 06/30/2022 CLINICAL DATA:  Chest pain EXAM: CHEST - 2 VIEW COMPARISON:  Radiograph 01/11/2011 FINDINGS: Cardiomediastinal silhouette is within normal limits. There is no focal airspace consolidation. There is no pleural effusion. No pneumothorax. There is no acute osseous abnormality. Thoracic spondylosis. IMPRESSION: No evidence of acute cardiopulmonary disease. Electronically Signed   By: Maurine Simmering M.D.   On: 06/30/2022 11:39    Procedures Procedures  {Document cardiac monitor, telemetry assessment procedure when appropriate:1}  Medications Ordered in ED Medications - No data to display  ED Course/ Medical Decision Making/ A&P                           Medical Decision  Making  ***  {Document critical care time when appropriate:1} {Document review of labs and clinical decision tools ie heart score, Chads2Vasc2 etc:1}  {Document your independent review of radiology images, and any outside records:1} {Document your discussion with family members, caretakers, and with consultants:1} {Document social determinants of health affecting pt's care:1} {Document your decision making why or why not admission, treatments were needed:1} Final Clinical Impression(s) / ED Diagnoses Final diagnoses:  None    Rx / DC Orders ED Discharge Orders     None

## 2022-07-01 ENCOUNTER — Ambulatory Visit (INDEPENDENT_AMBULATORY_CARE_PROVIDER_SITE_OTHER): Payer: No Typology Code available for payment source | Admitting: Neurology

## 2022-07-01 ENCOUNTER — Other Ambulatory Visit (INDEPENDENT_AMBULATORY_CARE_PROVIDER_SITE_OTHER): Payer: No Typology Code available for payment source

## 2022-07-01 ENCOUNTER — Encounter: Payer: Self-pay | Admitting: Neurology

## 2022-07-01 VITALS — Ht 64.0 in

## 2022-07-01 DIAGNOSIS — M4726 Other spondylosis with radiculopathy, lumbar region: Secondary | ICD-10-CM

## 2022-07-01 DIAGNOSIS — R209 Unspecified disturbances of skin sensation: Secondary | ICD-10-CM

## 2022-07-01 DIAGNOSIS — G629 Polyneuropathy, unspecified: Secondary | ICD-10-CM

## 2022-07-01 LAB — VITAMIN B12: Vitamin B-12: 177 pg/mL — ABNORMAL LOW (ref 211–911)

## 2022-07-01 LAB — HEMOGLOBIN A1C: Hgb A1c MFr Bld: 6.2 % (ref 4.6–6.5)

## 2022-07-01 NOTE — Patient Instructions (Addendum)
We will get lab work today to look for treatable causes of neuropathy. I will be in touch when I have the results of your lab work.  I will get the records from the neurologist in [place] before I make more recommendations as they may have done the testing I was considering.  Continue your gabapentin for back pain. Unfortunately, there are not good treatments for numbness, which is the primary problem.  You will think about which neurologist you would like to continue to follow. If you would like to see me again, I would recommend checking in again in about 6 months.  The physicians and staff at [facility] are committed to providing excellent care. You may receive a survey requesting feedback about your experience at our office. We strive to receive "very good" responses to the survey questions. If you feel that your experience would prevent you from giving the office a "very good " response, please contact our office to try to remedy the situation. We may be reached at [phone]. Thank you for taking the time out of your busy day to complete the survey.  Kai Levins, MD

## 2022-07-07 LAB — PROTEIN ELECTROPHORESIS, SERUM
Albumin ELP: 3.5 g/dL — ABNORMAL LOW (ref 3.8–4.8)
Alpha 1: 0.4 g/dL — ABNORMAL HIGH (ref 0.2–0.3)
Alpha 2: 0.6 g/dL (ref 0.5–0.9)
Beta 2: 0.7 g/dL — ABNORMAL HIGH (ref 0.2–0.5)
Beta Globulin: 0.5 g/dL (ref 0.4–0.6)
Gamma Globulin: 1.4 g/dL (ref 0.8–1.7)
Total Protein: 7.1 g/dL (ref 6.1–8.1)

## 2022-07-07 LAB — IMMUNOFIXATION ELECTROPHORESIS
IgG (Immunoglobin G), Serum: 1652 mg/dL — ABNORMAL HIGH (ref 600–1640)
IgM, Serum: 114 mg/dL (ref 50–300)
Immunofix Electr Int: NOT DETECTED
Immunoglobulin A: 322 mg/dL — ABNORMAL HIGH (ref 47–310)

## 2022-07-08 ENCOUNTER — Telehealth: Payer: Self-pay | Admitting: Neurology

## 2022-07-08 NOTE — Telephone Encounter (Signed)
Attempted to call patient about records received from [facility] (essentially normal EMG). She will need PT, if she agrees.  Kai Levins, MD [facility]

## 2022-07-08 NOTE — Telephone Encounter (Signed)
Patient called back. I explained that her EMG was essentially normal at [facility]. Her B12 level was low, so I recommended supplementation with 1000 mcg daily. Patient confirmed that recommendation and dosage.  I also recommended PT. Patient was reluctant and preferred not to do PT at this time as it did not help much in the past.  All questions were answered.  Kai Levins, MD [facility]

## 2022-07-15 ENCOUNTER — Telehealth: Payer: Self-pay

## 2022-07-15 NOTE — Patient Outreach (Signed)
  Care Coordination   07/15/2022 Name: Holly Cook MRN: [id number] DOB: 07-07-1966   Care Coordination Outreach Attempts:  An unsuccessful telephone outreach was attempted today to offer the patient information about available care coordination services as a benefit of their health plan.   Follow Up Plan:  Additional outreach attempts will be made to offer the patient care coordination information and services.   Encounter Outcome:  No Answer  Care Coordination Interventions Activated:  No   Care Coordination Interventions:  No, not indicated      Enzo Montgomery, RN,BSN,CCM [place] [facility] Telephonic Care Management Coordinator Direct Phone: [phone] Toll Free: [phone] Fax: [phone]

## 2022-07-16 ENCOUNTER — Telehealth: Payer: Self-pay | Admitting: Licensed Clinical Social Worker

## 2022-07-16 NOTE — Patient Outreach (Signed)
  Care Coordination   07/16/2022 Name: GLINDA NATZKE MRN: [id number] DOB: 03/09/1966   Care Coordination Outreach Attempts:  A second unsuccessful outreach was attempted today to offer the patient with information about available care coordination services as a benefit of their health plan.     Follow Up Plan:  Additional outreach attempts will be made to offer the patient care coordination information and services.   Encounter Outcome:  No Answer  Care Coordination Interventions Activated:  No   Care Coordination Interventions:  No, not indicated    Christa See, MSW, [email or web address] Phone [phone] 11:08 AM

## 2022-07-20 ENCOUNTER — Encounter: Payer: Self-pay | Admitting: *Deleted

## 2022-07-21 ENCOUNTER — Telehealth: Payer: Self-pay

## 2022-07-21 NOTE — Patient Outreach (Signed)
  Care Coordination   07/21/2022 Name: JEMIMA PETKO MRN: [id number] DOB: 12-Jul-1966   Care Coordination Outreach Attempts:  A third unsuccessful outreach was attempted today to offer the patient with information about available care coordination services as a benefit of their health plan.   Follow Up Plan:  No further outreach attempts will be made at this time. We have been unable to contact the patient to offer or enroll patient in care coordination services  Encounter Outcome:  No Answer  Care Coordination Interventions Activated:  No   Care Coordination Interventions:  No, not indicated      Enzo Montgomery, RN,BSN,CCM [place] [facility] Telephonic Care Management Coordinator Direct Phone: [phone] Toll Free: [phone] Fax: [phone]

## 2022-12-09 NOTE — Progress Notes (Deleted)
NEUROLOGY FOLLOW UP OFFICE NOTE  Holly Cook [id number]  Subjective:  Holly Cook is a 57 y.o. year old right-handed female with a medical history of seizures, HTN, CHF, ?DM2, OSA, lumbosacral radiculopathy, depression who we last saw on 07/01/22.  To briefly review: Patient has had numbness and tingling in her feet for a couple of years. It has been stable (not getting better or worse). She describes tingling and pulling sensation in her feet and then numbness. She does endorse back pain. The pain averages about 8/10. She finds it difficult to wear shoes and has to buy bigger shoes than normal. At first things were really bad at night, then was all day, and now it is near constant.   She endorses cramping in both legs. She thinks her legs are weak as well as they sometimes give out. She has occasional falls, last 2 weeks ago. Her falls are not as many as previous, only a couple of times in the past year. She feels like if she walks more, her feet hurt and swell.    She has done therapy, but more than a year ago.   She endorses difficulty with swallowing both solids and liquids. She endorses frequent choking. She endorses OSA and difficulty breathing at night, but suggests this is due to her CPAP being recalled.   Patient has previously been seen at the [place]. Per primary care documentation from 12/17/21 by Audrie Lia, patient has a history of lumbar spinal stenosis and disc herniation for which she follows with NSGY. She had a laminectomy on 12/2020 with some improvement but still has intermittent neuropathy in her feet. She was referred to neurology for this.   Of note, patient was seen by [facility] (Dr Jaynee Eagles) on 03/20/20 for numbness and tingling in both feet. Per documentation, patient had been previously evaluated by other neurologists, including with [facility] and EMG that were normal.   Patient was also seen by a neurologist in [place] last week per patient. Patient does  not know what was done or what they were thinking. She reports an EMG was done last week. I do not have the records from clinic or EMG. She said they did a lot of tests but she is not aware of results. Review of that EMG showed that it was normal.   Relevant/selective medications include gabapentin '400mg'$  qhs for back pain (does not help), Keppra '500mg'$  TID for seizures (no recent seizures), sertraline '200mg'$  qd for depression, trazodone '100mg'$  qhs for sleep. She has previously tried a cream she found online that did not help (she does not remember what it was)   EtOH use: No  Restrictive diet? No Family history of neuropathy/myopathy/NM disease? No, but sister had MS   Patient's goal is to be able to walk better.  Most recent Assessment and Plan (07/01/22): Holly Cook is a 57 y.o. female who presents for evaluation of numbness in bilateral feet. She has a relevant medical history of seizures, HTN, CHF, ?DM2, OSA, lumbosacral radiculopathy, depression. Patient endorses a pan-positive review of symptoms today. Her neurological examination is pertinent for reduced sensation in bilateral feet to vibration, hyporeflexia, and antalgic gait.    Prior EMGs have had conflicting data. A reported EMG from [facility] was normal and EMG from [facility] was read a radiculopathy, though reading the data seemed more consistent with polyneuropathy to me. She was reportedly evaluated by another neurologist in [place] yesterday and perhaps had another EMG, though these reports are  not available to me today.   Overall, symptoms seem most consistent with a distal symmetric polyneuropathy, likely secondary to abnormal glucose (prediabetes vs diabetes). I will evaluate for other treatable causes of neuropathy and get records from [facility] neurologist. We discussed that while there are treatments for tingling/burning pain, numbness has no treatments.   PLAN: -Blood work: B12, HbA1c, IFE, SPEP -Get  records: Signed a release of records: Phillips Odor, MD on 06/25/22 [address] Linna Hoff, [place] -Consider EMG pending prior work up -Continue current neuropathic medications for now (gabapentin 400 mg qhs)  Since their last visit: Labs were significant for low B12 (177). I recommended supplementation with 1000 mcg daily. HbA1c was 6.2.   I recommended PT on phone call on 07/08/22. Patient was reluctant and preferred not to do PT at that time as it had not helped much in the past.  MEDICATIONS:  Outpatient Encounter Medications as of 12/10/2022  Medication Sig   albuterol (VENTOLIN HFA) 108 (90 Base) MCG/ACT inhaler Inhale 2 puffs into the lungs every 6 (six) hours as needed for wheezing or shortness of breath.   aspirin EC 81 MG tablet Take 81 mg by mouth every other day. Swallow whole.   atorvastatin (LIPITOR) 20 MG tablet Take 1 tablet (20 mg total) by mouth daily at 6 PM.   carvedilol (COREG) 25 MG tablet Take 12.5 mg by mouth 2 (two) times daily with a meal.   cyclobenzaprine (FLEXERIL) 10 MG tablet Take 1 tablet (10 mg total) by mouth 2 (two) times daily as needed for muscle spasms.   gabapentin (NEURONTIN) 300 MG capsule Take 300 mg by mouth at bedtime.   levETIRAcetam (KEPPRA) 500 MG tablet Take 500 mg by mouth in the morning, at noon, and at bedtime.   montelukast (SINGULAIR) 10 MG tablet Take 1 tablet (10 mg total) by mouth at bedtime.   pantoprazole (PROTONIX) 40 MG tablet Take 1 tablet (40 mg total) by mouth daily.   Selenium Sulfide 2.25 % SHAM Apply 1 application topically 2 (two) times a week.   sertraline (ZOLOFT) 100 MG tablet Take 200 mg by mouth daily.   terbinafine (LAMISIL) 250 MG tablet Take 1 tablet (250 mg total) by mouth daily.   traZODone (DESYREL) 100 MG tablet Take 0.5-1 tablets (50-100 mg total) by mouth at bedtime.   triamcinolone cream (KENALOG) 0.1 % Apply 1 application topically 2 (two) times daily.   No facility-administered encounter medications on  file as of 12/10/2022.    PAST MEDICAL HISTORY: Past Medical History:  Diagnosis Date   CAD (coronary artery disease)    Diabetes mellitus without complication ([place])    patient states she was told this one but time but she doesn't have it   Sleep apnea    Stroke ([employer]) 2019   "slight stroke"     PAST SURGICAL HISTORY: Past Surgical History:  Procedure Laterality Date   BACK SURGERY     lumbar spine   HERNIA REPAIR     wrist and arm surgery      ALLERGIES: Allergies  Allergen Reactions   Tetanus Toxoids Other (See Comments)    Flu-like symptoms, injection site reaction; treated with antibiotics.  Not certain if reaction to vaccine or infection of injection site    FAMILY HISTORY: Family History  Problem Relation Age of Onset   Cancer Mother    Cancer Father    Multiple sclerosis Sister    Neuropathy Neg Hx     SOCIAL HISTORY: Social History  Tobacco Use   Smoking status: Never   Smokeless tobacco: Never  Vaping Use   Vaping Use: Never used  Substance Use Topics   Alcohol use: No   Drug use: No   Social History   Social History Narrative   Lives in independent living at [facility]    Right handed   Caffeine: maybe 2-3 cups daily      Objective:  Vital Signs:  There were no vitals taken for this visit.  ***  Labs and Imaging review: New results: 07/01/22: B12: 177 HbA1c: 6.2 IFE and SPEP: no M protein  Previously reviewed results: Internal labs: TSH (06/30/22): 3.298 BMP with elevated glucose (117) and Cr 1.08 CBC and Mg unremarkable Vit D (03/22/20): 27.4 (low) B12 (03/20/20): 310 MMA (03/20/20): 278 (wnl)   External labs: 06/05/21 labs: OBS9G: 5.9 Metabolic panel and CBC unremarkable   Lumbar spine xray (12/19/20): FINDINGS: PA and lateral view intraoperative fluoroscopic images of the lumbar spine are submitted, 2 images total. These images demonstrate new bilateral pedicle screws at the L3 level. Redemonstrated posterior spinal  fusion construct (bilateral pedicle screws and vertical interconnecting rods) at the L4-L5 level. Overlying retractors.   IMPRESSION: Two intraoperative fluoroscopic images of the lumbar spine, as described.    CT cervical spine wo contrast (09/23/18): IMPRESSION: No bony abnormality in the cervical spine.   MRI/MRA brain (04/22/16): FINDINGS: MRI HEAD FINDINGS   Cerebral volume normal.  No significant white matter disease.   No abnormal foci of restricted diffusion to suggest acute infarct. Gray-white matter differentiation maintained. Major intracranial vascular flow voids are preserved. No acute or chronic intracranial hemorrhage. No areas of chronic infarction.   No mass lesion, midline shift, or mass effect. No hydrocephalus. No extra-axial fluid collection. Major dural sinuses are grossly patent.   Craniocervical junction normal. Visualized upper cervical spine unremarkable.   Pituitary gland normal. No acute abnormality about the globes and orbits.   Paranasal sinuses are clear. No mastoid effusion. Inner ear structures grossly normal.   Bone marrow signal intensity within normal limits. No acute scalp soft tissue abnormality. Small lipoma noted at the left frontal scalp.   MRA HEAD FINDINGS   ANTERIOR CIRCULATION:   Visualized distal cervical segments of the internal carotid arteries are patent with antegrade flow. Petrous, cavernous, and supraclinoid segments widely patent. A1 segments, anterior communicating artery, and anterior cerebral arteries well opacified. M1 segments patent without stenosis or occlusion. MCA bifurcations normal. Distal MCA branches well opacified and symmetric.   POSTERIOR CIRCULATION:   Vertebral arteries patent to the vertebrobasilar junction. Posterior inferior cerebral arteries patent. Basilar artery widely patent. Superior cerebellar arteries patent bilaterally. Both of the posterior cerebral arteries arise in the basilar  artery and are well opacified to their distal aspects. Small right posterior communicating artery noted.   No aneurysm or vascular malformation.   IMPRESSION: 1. Normal brain MRI for patient age. No acute intracranial infarct or other process identified. 2. Normal intracranial MRA.   Date: 01/04/2019 ([place]) NCV & EMG Findings: Evaluation of the left fibular motor and the right fibular motor nerves showed reduced amplitude (L0.8, R0.4 mV). The left superficial fibular sensory nerve showed no response (14 cm). All remaining nerves (as indicated in the following tables) were within normal limits. Left vs. Right side comparison data for the fibular motor nerve indicates abnormal L-R velocity difference (Poplt-B Fib, 25 m/s).   EMG: See table  Impression:  1. Chronic bilateral L4, L5 and S1 radiculopathies. No active  denervation was seen. 2. No evidence of diffuse peripheral neuropathy or myopathy. 3. Low fibular CMAP amplitudes bilaterally are due to radiculopathy and/or degenerative changes in the feet. There is no evidence of entrapment at the fibular head.  _____________________________ Chales Salmon, M.D. Board Certified in [place] [facility], 06/25/22):   Assessment/Plan:  This is Holly Cook, a 57 y.o. female with: ***   Plan: ***  Return to clinic in ***  Total time spent reviewing records, interview, history/exam, documentation, and coordination of care on day of encounter:  *** min  Kai Levins, MD

## 2022-12-10 ENCOUNTER — Encounter: Payer: Self-pay | Admitting: Neurology

## 2022-12-10 ENCOUNTER — Ambulatory Visit: Payer: No Typology Code available for payment source | Admitting: Neurology

## 2022-12-10 DIAGNOSIS — Z029 Encounter for administrative examinations, unspecified: Secondary | ICD-10-CM

## 2022-12-31 ENCOUNTER — Ambulatory Visit: Payer: No Typology Code available for payment source | Admitting: Neurology

## 2024-03-15 ENCOUNTER — Encounter: Payer: Self-pay | Admitting: Neurology

## 2024-03-15 ENCOUNTER — Other Ambulatory Visit: Payer: Self-pay

## 2024-03-15 DIAGNOSIS — R202 Paresthesia of skin: Secondary | ICD-10-CM

## 2024-04-24 ENCOUNTER — Encounter: Admitting: Neurology

## 2024-04-26 NOTE — ED Provider Notes (Signed)
 I personally made/approved the management plan and take responsibility for the patient management.  I independently interpreted the following studies: None  Discussion with other professionals: None    Electronically Signed by: Thedore Kobs, MD  04/26/24 10:49 PM   Thedore Kobs, MD 04/26/24 2250

## 2024-05-09 ENCOUNTER — Other Ambulatory Visit (HOSPITAL_COMMUNITY): Payer: Self-pay | Admitting: General Surgery

## 2024-05-09 DIAGNOSIS — R5383 Other fatigue: Secondary | ICD-10-CM

## 2024-05-09 DIAGNOSIS — G6289 Other specified polyneuropathies: Secondary | ICD-10-CM

## 2024-05-09 DIAGNOSIS — G4733 Obstructive sleep apnea (adult) (pediatric): Secondary | ICD-10-CM

## 2024-05-15 ENCOUNTER — Encounter: Payer: Self-pay | Admitting: Neurology

## 2024-05-15 ENCOUNTER — Encounter: Admitting: Neurology

## 2024-05-16 ENCOUNTER — Encounter: Payer: Self-pay | Admitting: Neurology

## 2024-05-29 ENCOUNTER — Encounter: Attending: General Surgery | Admitting: Dietician

## 2024-05-29 ENCOUNTER — Encounter: Payer: Self-pay | Admitting: Dietician

## 2024-05-29 VITALS — Ht 64.0 in | Wt 295.8 lb

## 2024-05-29 DIAGNOSIS — E669 Obesity, unspecified: Secondary | ICD-10-CM | POA: Diagnosis present

## 2024-05-29 NOTE — Progress Notes (Signed)
 Nutrition Assessment for Bariatric Surgery: Pre-Surgery Behavioral and Nutrition Intervention Program   Medical Nutrition Therapy  Appt Start Time: 1124    End Time: 1250  Patient was seen on 05/29/2024 for Pre-Operative Nutrition Assessment. Purpose of todays visit  enhance perioperative outcomes along with a healthy weight maintenance   Referral stated Supervised Weight Loss (SWL) visits needed: 0  Not cleared at this time:  Pt to follow up for minimum of one more visit to assist pt with progressing through stages of change/further nutrition education. RD advised pt that this follow up visit is not mandated through insurance. Pt verbalized agreement.   Planned surgery: RYGB Pt expectation of surgery: to lose a lot of weight, be healthier and more mobile  NUTRITION ASSESSMENT   Anthropometrics  Start weight at [facility]: 295.8 lbs (date: 05/29/2024)  Height: 64 in BMI: 50.77 kg/m2     Clinical   Medical hx: pre-diabetes; sleep apnea; stroke Medications: see list  Labs: no recent labs in EMR (pt states she is planning to get them done) Notable signs/symptoms: pt arrived in a motorized chair/cart Suplements: Vit B12, Vit D Any previous deficiencies? No  Evaluation of Nutritional Deficiencies: Micronutrient Nutrition Focused Physical Exam: Hair: No issues observed Eyes: No issues observed Mouth: No issues observed Neck: No issues observed Nails: No issues observed Skin: No issues observed  Lifestyle & Dietary Hx  No food or drug allergies, stating just seasonal allergies (pollen). Pt states she likes to eat before 5 pm. Pt states she has been deficient in Vit D in the past.  Current Physical Activity Recommendations state 150 minutes per week of moderate to vigorous movement including Cardio and 1-2 days of resistance activities as well as flexibility/balance activities:  Pts current physical activity: chair exercises, 2-3 days per week, 30 minutes , with 50% recommendation  reached   Sleep Hygiene: duration and quality: pt states she takes a medication to help her sleep; uses CPAP machine  Current Patient Perceived Stress Level as stated by pt on a scale of 1-10:  3       Stress Management Techniques: listen to music, study the Bible  According to the Dietary Guidelines for Americans Recommendation: equivalent 1.5-2 cups fruits per day, equivalent 2-3 cups vegetables per day and at least half all grains whole  Fruit servings per day (on average): 2, meeting 100% recommendation  Non-starchy vegetable servings per day (on average): 1-2, meeting 50% recommendation  Whole Grains per day (on average): 1  Number of meals missed/skipped per week out of 21: 7  24-Hr Dietary Recall First Meal: cereal (honey cheerios or frosted flakes), lactaid milk Snack:  Second Meal: skip or late lunch (meat, vegetable, starch, and maybe cornbread or garlic bread) Snack:  Third Meal: skip because had late lunch or meat, vegetable, starch, and maybe cornbread or garlic bread Snack: crackers and tuna fish Beverages: orange juice, apple juice, Kool-aid, water  Alcoholic beverages per week: 0   Estimated Energy Needs Calories: 1500  NUTRITION DIAGNOSIS  Overweight/obesity ([place]-3.3) related to past poor dietary habits and physical inactivity as evidenced by patient w/ planned RYGB surgery following dietary guidelines for continued weight loss.  NUTRITION INTERVENTION  Nutrition counseling (C-1) and education (E-2) to facilitate bariatric surgery goals.  Educated pt on micronutrient deficiencies post-surgery and behavioral/dietary strategies to start in order to mitigate that risk   Behavioral and Dietary Interventions Pre-Op Goals Reviewed with the Patient Nutrition: Healthy Eating Behaviors Switch to non-caloric, non-carbonated and non-caffeinated beverages such as  water, unsweetened tea, Crystal Light and zero calorie beverages (aim for 64 oz. per day) Cut out grazing  between meals or at night  Find a protein shake you like Eat every 3-5 hours        Eliminate distractions while eating (TV, computer, reading, driving, texting) Take 79-69 minutes to eat a meal  Decrease high sugar foods/decrease high fat/fried foods Eliminate alcoholic beverages Increase protein intake (eggs, fish, chicken, yogurt) before surgery Eat non starchy vegetables 2 times a day 7 days a week Eat complex carbohydrates such as whole grains and fruits Practice not drinking with meals; stop 15 minutes before, make sure you wait 30 minutes after eating   Behavioral Modification: Physical Activity Increase my usual daily activity (use stairs, park farther, etc.) Engage in _______________________  activity  _______ minutes ______ times per week  Other:    _________________________________________________________________     Problem Solving I will think about my usual eating patterns and how to tweak them How can my friends and family support me Barriers to starting my changes Learn and understand appetite verses hunger   Healthy Coping Allow for ___________ activities per week to help me manage stress Reframe negative thoughts I will keep a picture of someone or something that is my inspiration & look at it daily   Monitoring  Weigh myself once a week  Measure my progress by monitoring how my clothes fit Keep a food record of what I eat and drink for the next ________ (time period) Take pictures of what I eat and drink for the next ________ (time period) Use an app to count steps/day for the next_______ (time period) Measure my progress such as increased energy and more restful sleep Monitor your acid reflux and bowel habits, are they getting better?   *Goals that are bolded indicate the pt would like to start working towards these  Handouts Provided Include  Bariatric Surgery handouts (Nutrition Visits, Pre Surgery Behavioral Change Goals, Protein Shakes Brands to Choose  From, Vitamins & Mineral Supplementation)  Learning Style & Readiness for Change Teaching method utilized: Visual, Auditory, and hands on  Demonstrated degree of understanding via: Teach Back  Readiness Level: preparation Barriers to learning/adherence to lifestyle change: mobility  RD's Notes for Next Visit Patient progress toward chosen goals.   MONITORING & EVALUATION Dietary intake, weekly physical activity, body weight, and preoperative behavioral change goals   Next Steps  Pt to follow up in 2-3 weeks for minimum of one more visit to assist pt with progressing through stages of change/further nutrition education.

## 2024-05-30 ENCOUNTER — Ambulatory Visit (HOSPITAL_COMMUNITY)
Admission: RE | Admit: 2024-05-30 | Discharge: 2024-05-30 | Disposition: A | Discharge status: Home / Self Care | Source: Ambulatory Visit | Attending: General Surgery | Admitting: General Surgery

## 2024-05-30 ENCOUNTER — Other Ambulatory Visit: Payer: Self-pay

## 2024-05-30 ENCOUNTER — Encounter (HOSPITAL_COMMUNITY)
Admission: RE | Admit: 2024-05-30 | Discharge: 2024-05-30 | Disposition: A | Discharge status: Home / Self Care | Source: Ambulatory Visit | Attending: General Surgery

## 2024-05-30 DIAGNOSIS — G6289 Other specified polyneuropathies: Secondary | ICD-10-CM

## 2024-05-30 DIAGNOSIS — R5383 Other fatigue: Secondary | ICD-10-CM | POA: Diagnosis present

## 2024-05-30 DIAGNOSIS — G4733 Obstructive sleep apnea (adult) (pediatric): Secondary | ICD-10-CM | POA: Diagnosis present

## 2024-05-30 DIAGNOSIS — Z01818 Encounter for other preprocedural examination: Secondary | ICD-10-CM | POA: Insufficient documentation

## 2024-06-10 LAB — AMB RESULTS CONSOLE CBG

## 2024-06-10 NOTE — Progress Notes (Signed)
Declined SDOH.

## 2024-06-12 ENCOUNTER — Encounter: Attending: General Surgery | Admitting: Dietician

## 2024-06-12 ENCOUNTER — Encounter: Payer: Self-pay | Admitting: Dietician

## 2024-06-12 VITALS — Ht 64.0 in | Wt 295.6 lb

## 2024-06-12 DIAGNOSIS — E669 Obesity, unspecified: Secondary | ICD-10-CM | POA: Insufficient documentation

## 2024-06-12 NOTE — Progress Notes (Signed)
 Supervised Weight Loss Visit Bariatric Nutrition Education Appt Start Time: 1137    End Time: 1215  Referral stated Supervised Weight Loss (SWL) visits needed: 0  Pt completed visits.   Pt has cleared nutrition requirements.   Planned surgery: RYGB Pt expectation of surgery: to lose a lot of weight, be healthier and more mobile  NUTRITION ASSESSMENT   Anthropometrics  Start weight at [facility]: 295.8 lbs (date: 05/29/2024)  Height: 64 in Weight today: 295.6 lbs BMI: 50.74 kg/m2     Clinical   Medical hx: pre-diabetes; sleep apnea; stroke Medications: see list  Labs: no recent labs in EMR (pt states she is planning to get them done) Notable signs/symptoms: pt arrived in a motorized chair/cart Suplements: Vit B12, Vit D Any previous deficiencies? No  Lifestyle & Dietary Hx Pt arrived on a motorized scooter, stating she is walking better too. Pt states her fluid intake is better, stating she drinks 2-32 oz bottles Pt found a protein shake that we reviewed during the visit. Pt states she is eating throughout the day and not skipping meals. Pt states she is practicing not drinking with meals, stating that it is going well. Pt states she has been walking a little better now.  Estimated daily fluid intake: 64 oz Supplements: Vit B12 and Vit D Current average weekly physical activity: ADLs; chair exercises 3 days per week, 20 minutes  24-Hr Dietary Recall First Meal: cereal (honey cheerios or frosted flakes), lactaid milk Snack:  Second Meal: skip or late lunch (meat, vegetable, starch, and maybe cornbread or garlic bread) Snack:  Third Meal: skip because had late lunch or meat, vegetable, starch, and maybe cornbread or garlic bread Snack: crackers and tuna fish Beverages: water, apple juice or orange juice once in awhile  Alcoholic beverages per week: 0   Estimated Energy Needs Calories: 1500  NUTRITION DIAGNOSIS  Overweight/obesity ([place]-3.3) related to past poor dietary  habits and physical inactivity as evidenced by patient w/ planned RYGB surgery following dietary guidelines for continued weight loss.  NUTRITION INTERVENTION  Nutrition counseling (C-1) and education (E-2) to facilitate bariatric surgery goals.  Before bariatric surgery, finding a protein shake you enjoy that contains at least 15 grams of protein and no more than 5 grams of total carbohydrate is essential for post-op recovery. These shakes help preserve muscle mass and support nutritional needs during the post-surgery diet. Aiming for at least 60 grams of protein per day. Since taste preferences can change after surgery, it's helpful to identify three flavors you like--such as chocolate, vanilla, and caramel--to increase the chances of having a go-to option you'll still enjoy post-op.  Pre-Op Goals Reviewed with the Patient  Pre-Op Goals Progress & New Goals Continue: find a protein shake you like Continue: Eat every 3-5 hours Continue: Practice not drinking with meals; stop 15 minutes before, make sure you wait 30 minutes after eating  Handouts Provided Include    Learning Style & Readiness for Change Teaching method utilized: Visual & Auditory  Demonstrated degree of understanding via: Teach Back  Readiness Level: preparation Barriers to learning/adherence to lifestyle change: mobility  RD's Notes for next Visit    MONITORING & EVALUATION Dietary intake, weekly physical activity, body weight, and pre-op goals.  Next Steps  Pt has completed visits. No further supervised visits required/recommended. Patient is to return to [facility] for pre-op class >2 weeks prior to scheduled surgery.

## 2024-06-19 NOTE — Progress Notes (Deleted)
  Cardiology Office Note:  .   Date:  06/19/2024  ID:  Holly Cook, DOB 09-Jun-1966, MRN [id number] PCP: Theotis Haze ORN, NP  [facility] Providers Cardiologist:  None { Click to update primary MD,subspecialty MD or APP then REFRESH:1}   History of Present Illness: .   No chief complaint on file.   Holly Cook is a 58 y.o. female with history of obesity who presents for the evaluation of chest pain at the request of Theotis Haze ORN, NP.     Problem List Obesity  -BMI 51 2. GERD  3. OSA    ROS: All other ROS reviewed and negative. Pertinent positives noted in the HPI.     Studies Reviewed: SABRA       Physical Exam:   VS:  There were no vitals taken for this visit.   Wt Readings from Last 3 Encounters:  06/12/24 295 lb 9.6 oz (134.1 kg)  05/29/24 295 lb 12.8 oz (134.2 kg)  06/30/22 270 lb (122.5 kg)    GEN: Well nourished, well developed in no acute distress NECK: No JVD; No carotid bruits CARDIAC: ***RRR, no murmurs, rubs, gallops RESPIRATORY:  Clear to auscultation without rales, wheezing or rhonchi  ABDOMEN: Soft, non-tender, non-distended EXTREMITIES:  No edema; No deformity  ASSESSMENT AND PLAN: .   ***    {Are you ordering a CV Procedure (e.g. stress test, cath, DCCV, TEE, etc)?   Press F2        :[id number]}   Follow-up: No follow-ups on file.  Time Spent with Patient: I have spent a total of *** minutes caring for this patient today face to face, ordering and reviewing labs/tests, reviewing prior records/medical history, examining the patient, establishing an assessment and plan, communicating results/findings to the patient/family, and documenting in the medical record.   Signed, Darryle ONEIDA. Barbaraann, MD, [facility]  [facility]  [address] [place], [place] [address] [phone]  9:07 PM

## 2024-06-22 ENCOUNTER — Ambulatory Visit: Payer: Medicare (Managed Care) | Attending: Cardiovascular Disease | Admitting: Cardiovascular Disease

## 2024-07-04 ENCOUNTER — Ambulatory Visit (INDEPENDENT_AMBULATORY_CARE_PROVIDER_SITE_OTHER): Admitting: Neurology

## 2024-07-04 DIAGNOSIS — R202 Paresthesia of skin: Secondary | ICD-10-CM

## 2024-07-04 DIAGNOSIS — G629 Polyneuropathy, unspecified: Secondary | ICD-10-CM

## 2024-07-04 NOTE — Procedures (Signed)
 [facility]  [address] [place], [address]  [place], [place] [address] Tel: [phone] Fax: [phone] Test Date:  07/04/2024  Patient: Holly Cook DOB: [birth year] Physician: Venetia Potters, MD  Sex: Female Height: 5' 4 Ref Phys: Deward Moles, MD  ID#: [id number]   Technician:    History: This is a 58 year old female with numbness in her feet.  NCV & EMG Findings: Extensive electrodiagnostic evaluation of bilateral lower limbs shows: Bilateral sural and superficial peroneal/fibular sensory responses are absent. Bilateral peroneal/fibular (EDB) motor responses are absent. Bilateral tibial (AH) and peroneal/fibular (TA) motor responses are within normal limits. Bilateral H reflex responses are absent. Chronic motor axon loss changes without accompanying active denervation changes are seen in bilateral flexor digitorum longus muscles. Lumbosacral paraspinal muscles were deferred due to prior lumbosacral spine surgery.  Impression: This is an abnormal study. The findings are most consistent with the following: Evidence of a length dependent large fiber sensorimotor polyneuropathy. The findings are at least mild to moderate in degree electrically. No definitive electrodiagnostic evidence of a right or left lumbosacral (L3-S1) motor radiculopathy, though distal manifestations of radiculopathy would be difficult to detect given #1 above.    ___________________________ Venetia Potters, MD    Nerve Conduction Studies Motor Nerve Results    Latency Amplitude F-Lat Segment Distance CV Comment  Site (ms) Norm (mV) Norm (ms)  (cm) (m/s) Norm   Left Fibular (EDB) Motor  Ankle *NR  < 6.0 *NR  > 2.5        Bel fib head *NR - *NR -  Bel fib head-Ankle - *NR  > 40   Pop fossa *NR - *NR -  Pop fossa-Bel fib head - *NR -   Right Fibular (EDB) Motor  Ankle *NR  < 6.0 *NR  > 2.5        Bel fib head *NR - *NR -  Bel fib head-Ankle - *NR  > 40   Pop fossa *NR - *NR -  Pop fossa-Bel fib head  - *NR -   Left Fibular (TA) Motor  Fib head 1.95  < 4.5 6.6  > 3.0        Pop fossa 3.3  < 6.7 6.3 -  Pop fossa-Fib head 9 67  > 40   Right Fibular (TA) Motor  Fib head 2.3  < 4.5 5.4  > 3.0        Pop fossa 4.0  < 6.7 5.4 -  Pop fossa-Fib head 9 53  > 40   Left Tibial (AH) Motor  Ankle 3.6  < 6.0 8.3  > 4.0        Knee 12.3 - 5.8 -  Knee-Ankle 37 43  > 40   Right Tibial (AH) Motor  Ankle 3.9  < 6.0 11.1  > 4.0        Knee 12.9 - 8.0 -  Knee-Ankle 41 46  > 40    Sensory Sites    Neg Peak Lat Amplitude (O-P) Segment Distance Velocity Comment  Site (ms) Norm (V) Norm  (cm) (ms)   Left Superficial Fibular Sensory  14 cm-Ankle *NR  < 4.6 *NR  > 4 14 cm-Ankle 14    Right Superficial Fibular Sensory  14 cm-Ankle *NR  < 4.6 *NR  > 4 14 cm-Ankle 14    Left Sural Sensory  Calf-Lat mall *NR  < 4.6 *NR  > 4 Calf-Lat mall 14    Right Sural Sensory  Calf-Lat mall *NR  <  4.6 *NR  > 4 Calf-Lat mall 14     H-Reflex Results    M-Lat H Lat H Neg Amp H-M Lat  Site (ms) (ms) Norm (mV) (ms)  Left Tibial H-Reflex  Pop fossa 6.1 -  < 35.0 - -  Right Tibial H-Reflex  Pop fossa 6.3 -  < 35.0 - -   Electromyography   Side Muscle Ins.Act Fibs Fasc Recrt Amp Dur Poly Activation Comment  Right Tib ant Nml Nml Nml Nml Nml Nml Nml Nml N/A  Right Gastroc MH Nml Nml Nml Nml Nml Nml Nml Nml N/A  Right FDL Nml Nml Nml *1- *1+ *1+ Nml Nml N/A  Right Vastus lat Nml Nml Nml Nml Nml Nml Nml Nml N/A  Right Biceps fem SH Nml Nml Nml Nml Nml Nml Nml Nml N/A  Right Gluteus med Nml Nml Nml Nml Nml Nml Nml Nml N/A  Left Tib ant Nml Nml Nml Nml Nml Nml Nml Nml N/A  Left Gastroc MH Nml Nml Nml Nml Nml Nml Nml Nml N/A  Left FDL Nml Nml Nml *1- *1+ *1+ Nml Nml N/A  Left Vastus lat Nml Nml Nml Nml Nml Nml Nml Nml N/A  Left Biceps fem SH Nml Nml Nml Nml Nml Nml Nml Nml N/A  Left Gluteus med Nml Nml Nml Nml Nml Nml Nml Nml N/A      Waveforms:  Motor               Sensory           H-Reflex

## 2024-07-11 ENCOUNTER — Other Ambulatory Visit (INDEPENDENT_AMBULATORY_CARE_PROVIDER_SITE_OTHER)

## 2024-07-11 ENCOUNTER — Other Ambulatory Visit: Payer: Self-pay

## 2024-07-11 ENCOUNTER — Ambulatory Visit (INDEPENDENT_AMBULATORY_CARE_PROVIDER_SITE_OTHER): Admitting: Orthopaedic Surgery

## 2024-07-11 DIAGNOSIS — G8929 Other chronic pain: Secondary | ICD-10-CM | POA: Diagnosis not present

## 2024-07-11 DIAGNOSIS — M545 Low back pain, unspecified: Secondary | ICD-10-CM

## 2024-07-11 NOTE — Progress Notes (Signed)
 Office Visit Note   Patient: Holly Cook           Date of Birth: [birth year]           MRN: [id number] Visit Date: 07/11/2024              Requested by: Theotis Haze ORN, NP [address] [place],  [place] [address] PCP: Theotis Haze ORN, NP   Assessment & Plan: Visit Diagnoses:  1. Chronic midline low back pain, unspecified whether sciatica present     Plan: History of Present Illness Holly Cook is a 58 year old female who presents with back pain radiating to both feet and numbness.  She experiences significant back pain radiating to both feet, accompanied by numbness. Despite two prior back surgeries at the [facility], her symptoms persist. She uses a motorized wheelchair for mobility outside the home due to difficulty walking, although she can walk short distances without it at home. A recent MRI was performed at [facility]. Previous back injections did not alleviate her symptoms. She experiences severe numbness and weakness in her legs, leading to falls.  Examination shows no focal findings.  Function of the lower extremities at baseline.  Results RADIOLOGY Lumbar spine MRI: Performed at [facility] (07/04/2024)  Assessment and Plan Chronic low back pain with bilateral lower extremity radiculopathy Persistent pain and numbness in feet, leg weakness, and falls despite two surgeries and ineffective back injections. Requires specialist evaluation due to case complexity. - Refer to a specialist with expertise in spine issues for further evaluation and management.  Follow-Up Instructions: No follow-ups on file.   Orders:  Orders Placed This Encounter  Procedures   XR Lumbar Spine 2-3 Views   No orders of the defined types were placed in this encounter.     Procedures: No procedures performed   Clinical Data: No additional findings.   Subjective: Chief Complaint  Patient presents with   Lower Back - Pain    HPI  Review of Systems   Constitutional: Negative.   HENT: Negative.    Eyes: Negative.   Respiratory: Negative.    Cardiovascular: Negative.   Endocrine: Negative.   Musculoskeletal: Negative.   Neurological: Negative.   Hematological: Negative.   Psychiatric/Behavioral: Negative.    All other systems reviewed and are negative.    Objective: Vital Signs: There were no vitals taken for this visit.  Physical Exam Vitals and nursing note reviewed.  Constitutional:      Appearance: She is well-developed.  HENT:     Head: Atraumatic.     Nose: Nose normal.  Eyes:     Extraocular Movements: Extraocular movements intact.  Cardiovascular:     Pulses: Normal pulses.  Pulmonary:     Effort: Pulmonary effort is normal.  Abdominal:     Palpations: Abdomen is soft.  Musculoskeletal:     Cervical back: Neck supple.  Skin:    General: Skin is warm.     Capillary Refill: Capillary refill takes less than 2 seconds.  Neurological:     Mental Status: She is alert. Mental status is at baseline.  Psychiatric:        Behavior: Behavior normal.        Thought Content: Thought content normal.        Judgment: Judgment normal.     Ortho Exam  Specialty Comments:  No specialty comments available.  Imaging: No results found.   PMFS History: Patient Active Problem List  Diagnosis Date Noted   OSA (obstructive sleep apnea) 06/05/2021   Spinal stenosis 12/19/2020   Lumbar stenosis with neurogenic claudication 12/19/2020   Small fiber neuropathy 03/20/2020   TIA (transient ischemic attack) 04/22/2016   Past Medical History:  Diagnosis Date   CAD (coronary artery disease)    Diabetes mellitus without complication (HCC)    patient states she was told this one but time but she doesn't have it   Sleep apnea    Stroke (HCC) 2019   slight stroke     Family History  Problem Relation Age of Onset   Cancer Mother    Cancer Father    Multiple sclerosis Sister    Neuropathy Neg Hx     Past  Surgical History:  Procedure Laterality Date   BACK SURGERY     lumbar spine   HERNIA REPAIR     wrist and arm surgery     Social History   Occupational History   Not on file  Tobacco Use   Smoking status: Never   Smokeless tobacco: Never  Vaping Use   Vaping status: Never Used  Substance and Sexual Activity   Alcohol use: No   Drug use: No   Sexual activity: Yes    Birth control/protection: None

## 2024-07-12 ENCOUNTER — Telehealth: Payer: Self-pay

## 2024-07-12 NOTE — Telephone Encounter (Signed)
 Hey, patient was referred to [facility] and [facility] yesterday. Looks like you have tried calling to schedule, and had to leave a message. Patient was returning your call.

## 2024-07-12 NOTE — Telephone Encounter (Signed)
 Patient called and left message advising she was waiting on a referral o Dr. JERRI friend for her issue??? Front desk transferred to [employer].

## 2024-07-17 ENCOUNTER — Ambulatory Visit (HOSPITAL_COMMUNITY): Payer: Self-pay | Admitting: Licensed Clinical Social Worker

## 2024-07-17 ENCOUNTER — Encounter (HOSPITAL_COMMUNITY): Payer: Self-pay

## 2024-07-17 DIAGNOSIS — Z91199 Patient's noncompliance with other medical treatment and regimen due to unspecified reason: Secondary | ICD-10-CM

## 2024-07-26 NOTE — Progress Notes (Signed)
 Pt did not complete paperwork by assessment deadline so appointment will be cancelled and rescheduled. Per [facility] policy, visit will be considered a no show.

## 2024-08-08 ENCOUNTER — Ambulatory Visit (HOSPITAL_COMMUNITY): Admitting: Licensed Clinical Social Worker

## 2024-08-15 ENCOUNTER — Ambulatory Visit: Admitting: Physical Medicine and Rehabilitation

## 2024-08-15 ENCOUNTER — Encounter: Payer: Self-pay | Admitting: Physical Medicine and Rehabilitation

## 2024-08-15 DIAGNOSIS — Y991 Military activity: Secondary | ICD-10-CM | POA: Insufficient documentation

## 2024-08-15 DIAGNOSIS — H0288B Meibomian gland dysfunction left eye, upper and lower eyelids: Secondary | ICD-10-CM | POA: Insufficient documentation

## 2024-08-15 DIAGNOSIS — D259 Leiomyoma of uterus, unspecified: Secondary | ICD-10-CM | POA: Insufficient documentation

## 2024-08-15 DIAGNOSIS — M5442 Lumbago with sciatica, left side: Secondary | ICD-10-CM | POA: Diagnosis not present

## 2024-08-15 DIAGNOSIS — H04123 Dry eye syndrome of bilateral lacrimal glands: Secondary | ICD-10-CM | POA: Insufficient documentation

## 2024-08-15 DIAGNOSIS — M961 Postlaminectomy syndrome, not elsewhere classified: Secondary | ICD-10-CM

## 2024-08-15 DIAGNOSIS — G8929 Other chronic pain: Secondary | ICD-10-CM | POA: Diagnosis not present

## 2024-08-15 DIAGNOSIS — D649 Anemia, unspecified: Secondary | ICD-10-CM | POA: Insufficient documentation

## 2024-08-15 DIAGNOSIS — I50812 Chronic right heart failure: Secondary | ICD-10-CM | POA: Insufficient documentation

## 2024-08-15 DIAGNOSIS — I10 Essential (primary) hypertension: Secondary | ICD-10-CM | POA: Insufficient documentation

## 2024-08-15 DIAGNOSIS — F339 Major depressive disorder, recurrent, unspecified: Secondary | ICD-10-CM | POA: Insufficient documentation

## 2024-08-15 DIAGNOSIS — F411 Generalized anxiety disorder: Secondary | ICD-10-CM | POA: Insufficient documentation

## 2024-08-15 DIAGNOSIS — M5441 Lumbago with sciatica, right side: Secondary | ICD-10-CM | POA: Diagnosis not present

## 2024-08-15 DIAGNOSIS — E785 Hyperlipidemia, unspecified: Secondary | ICD-10-CM | POA: Insufficient documentation

## 2024-08-15 DIAGNOSIS — M792 Neuralgia and neuritis, unspecified: Secondary | ICD-10-CM | POA: Insufficient documentation

## 2024-08-15 DIAGNOSIS — H524 Presbyopia: Secondary | ICD-10-CM | POA: Insufficient documentation

## 2024-08-15 DIAGNOSIS — F4312 Post-traumatic stress disorder, chronic: Secondary | ICD-10-CM | POA: Insufficient documentation

## 2024-08-15 DIAGNOSIS — Z124 Encounter for screening for malignant neoplasm of cervix: Secondary | ICD-10-CM | POA: Insufficient documentation

## 2024-08-15 DIAGNOSIS — E119 Type 2 diabetes mellitus without complications: Secondary | ICD-10-CM | POA: Insufficient documentation

## 2024-08-15 DIAGNOSIS — M5416 Radiculopathy, lumbar region: Secondary | ICD-10-CM | POA: Diagnosis not present

## 2024-08-15 NOTE — Progress Notes (Signed)
 Pain Scale   Average Pain 8 Patient advising she has chronic lower back pain radiating bilaterally to legs causing numbness. Patient also advising she has had 2 back surgeries          +Driver, -BT, -Dye Allergies.
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
## 2024-08-18 NOTE — Progress Notes (Signed)
 The patient attended a screening event on 06/10/2024, where her blood pressure was measured at 115/89 mmHg. During the event, the patient did not report her smoking, has Medicare for her insurance, is established with a primary care provider Walda Servant, NP), and declined the SDOH screener.   A chart review indicates Haze Servant, NP - [facility] as her PCP. There are currently no [facility] visible encounters with this provider within the last 12 months. [facility], recent office visit is 08/15/2024. Chart review further indicates that the pt has [facility] and [employer] [facility] indicated as insurance. She is not a smoker. She does not have any SDOH needs at this time per chart review.   Call Attempt #1: [facility] called pt to obtain PCP status. Left vm for pt to return call.  Call Attempt #2: CHW called pt again to obtain PCP status. CHW was unable to leave another vm during call.  Incoming Call: CHW asked pt who is her current PCP. Pt shared she [facility]. CHW updated chart to reflect this update.  At this time, no additional support from the [facility] Team is indicated.

## 2024-08-28 NOTE — Progress Notes (Unsigned)
 Cardiology Office Note:    Date:  08/29/2024   ID:  Holly Cook, DOB 09-26-1966, MRN [id number]  PCP:  [facility]  Cardiologist:  Lonni LITTIE Nanas, MD  Electrophysiologist:  None   Referring MD: [facility]   Chief Complaint  Patient presents with   Chest Pain    History of Present Illness:    Holly Cook is a 58 y.o. female with a hx of hyperlipidemia, prediabetes, OSA who is referred for evaluation of chest pain.  She reports has been having palpitations where feels like heart is racing, occurs a few times per week and can last for minutes.  Will wake her up from sleep.  She reports she has a CPAP machine but has not been compliant.  Also reports having chest pain.  Reports pain is on left side of chest.  Can occur while having palpitations but also happens outside of this.  She does not exercise.  Has not noted a clear relationship with chest pain and exertion but does report she gets dyspnea with exertion.  No smoking history.  No known family history of heart disease.   Past Medical History:  Diagnosis Date   CAD (coronary artery disease)    Diabetes mellitus without complication (HCC)    patient states she was told this one but time but she doesn't have it   Sleep apnea    Stroke (HCC) 2019   slight stroke     Past Surgical History:  Procedure Laterality Date   BACK SURGERY     lumbar spine   HERNIA REPAIR     wrist and arm surgery      Current Medications: Current Meds  Medication Sig   albuterol  (VENTOLIN  HFA) 108 (90 Base) MCG/ACT inhaler Inhale 2 puffs into the lungs every 6 (six) hours as needed for wheezing or shortness of breath.   aspirin  EC 81 MG tablet Take 81 mg by mouth every other day. Swallow whole.   atorvastatin  (LIPITOR) 20 MG tablet Take 1 tablet (20 mg total) by mouth daily at 6 PM.   carboxymethylcellulose (REFRESH PLUS) 0.5 % SOLN Place 2 drops into both eyes in the morning and at bedtime.   carvedilol   (COREG ) 25 MG tablet Take 12.5 mg by mouth 2 (two) times daily with a meal.   cholecalciferol (VITAMIN D3) 25 MCG (1000 UNIT) tablet Take 5,000 Units by mouth daily.   cyclobenzaprine  (FLEXERIL ) 10 MG tablet Take 1 tablet (10 mg total) by mouth 2 (two) times daily as needed for muscle spasms.   levETIRAcetam  (KEPPRA ) 500 MG tablet Take 500 mg by mouth in the morning, at noon, and at bedtime.   Lifitegrast 5 % SOLN Place 1 drop into both eyes 2 (two) times daily.   montelukast  (SINGULAIR ) 10 MG tablet Take 1 tablet (10 mg total) by mouth at bedtime.   olopatadine (PATANOL) 0.1 % ophthalmic solution Place 1 drop into both eyes 2 (two) times daily.   pantoprazole  (PROTONIX ) 40 MG tablet Take 1 tablet (40 mg total) by mouth daily.   sertraline  (ZOLOFT ) 100 MG tablet Take 200 mg by mouth daily.   terbinafine  (LAMISIL ) 250 MG tablet Take 1 tablet (250 mg total) by mouth daily.   traZODone  (DESYREL ) 100 MG tablet Take 0.5-1 tablets (50-100 mg total) by mouth at bedtime.   triamcinolone  cream (KENALOG ) 0.1 % Apply 1 application topically 2 (two) times daily.     Allergies:   Tetanus toxoid-containing vaccines   Social  History   Socioeconomic History   Marital status: Married    Spouse name: Not on file   Number of children: Not on file   Years of education: Not on file   Highest education level: Not on file  Occupational History   Not on file  Tobacco Use   Smoking status: Never   Smokeless tobacco: Never  Vaping Use   Vaping status: Never Used  Substance and Sexual Activity   Alcohol use: No   Drug use: No   Sexual activity: Yes    Birth control/protection: None  Other Topics Concern   Not on file  Social History Narrative   Lives in independent living at [facility]    Right handed   Caffeine: maybe 2-3 cups daily   Social Drivers of Health   Financial Resource Strain: Not on file  Food Insecurity: Patient Declined (06/10/2024)   Hunger Vital Sign    Worried About Running Out  of Food in the Last Year: Patient declined    Ran Out of Food in the Last Year: Patient declined  Transportation Needs: Patient Declined (06/10/2024)   PRAPARE - Administrator, Civil Service (Medical): Patient declined    Lack of Transportation (Non-Medical): Patient declined  Physical Activity: Not on file  Stress: Not on file  Social Connections: Unknown (03/16/2022)   Received from [employer]   Social Network    Social Network: Not on file     Family History: The patient's family history includes Cancer in her father and mother; Multiple sclerosis in her sister. There is no history of Neuropathy.  ROS:   Please see the history of present illness.     All other systems reviewed and are negative.  EKGs/Labs/Other Studies Reviewed:    The following studies were reviewed today:   EKG:   08/29/2024: Normal sinus rhythm, rate 86, nonspecific T wave flattening  Recent Labs: No results found for requested labs within last 365 days.  Recent Lipid Panel    Component Value Date/Time   CHOL 239 (H) 04/23/2016 0215   TRIG 97 04/23/2016 0215   HDL 53 04/23/2016 0215   CHOLHDL 4.5 04/23/2016 0215   VLDL 19 04/23/2016 0215   LDLCALC 167 (H) 04/23/2016 0215    Physical Exam:    VS:  BP 111/81 (BP Location: Left Arm, Patient Position: Sitting, Cuff Size: Large)   Pulse 82   Ht 5' 4 (1.626 m)   Wt 296 lb (134.3 kg)   SpO2 93%   BMI 50.81 kg/m     Wt Readings from Last 3 Encounters:  08/29/24 296 lb (134.3 kg)  06/12/24 295 lb 9.6 oz (134.1 kg)  05/29/24 295 lb 12.8 oz (134.2 kg)     GEN:  Well nourished, well developed in no acute distress HEENT: Normal NECK: No JVD; No carotid bruits LYMPHATICS: No lymphadenopathy CARDIAC: RRR, no murmurs, rubs, gallops RESPIRATORY:  Clear to auscultation without rales, wheezing or rhonchi  ABDOMEN: Soft, non-tender, non-distended MUSCULOSKELETAL:  No edema; No deformity  SKIN: Warm and dry NEUROLOGIC:  Alert and  oriented x 3 PSYCHIATRIC:  Normal affect   ASSESSMENT:    1. Chest pain of uncertain etiology   2. DOE (dyspnea on exertion)   3. Screening for cardiovascular condition   4. Palpitations   5. Hyperlipidemia, unspecified hyperlipidemia type   6. Morbid obesity (HCC)    PLAN:    Chest pain/DOE: Chest pain is atypical in description but also reporting dyspnea  on exertion that could represent anginal equivalent.  Does have risk factors for CAD (age, hyperlipidemia).  Not a treadmill candidate.  Not a good coronary CTA candidate given BMI.  Recommend stress PET to evaluate for ischemia.  Palpitations: Description concerning for arrhythmia, evaluate with Zio patch x 2 weeks.  Check echocardiogram to rule out structural heart disease.    Hyperlipidemia: On atorvastatin  20 mg daily.  LDL 151 on 08/18/2024.  Will follow-up results of stress PET to guide how aggressive to be in managing cholesterol  OSA: Reports has not been using CPAP, encouraged compliance  Prediabetes: A1c 5.7% on 08/18/2024  Morbid obesity: Body mass index is 50.81 kg/m. Diet/exercise recommended  RTC in 4 months  Informed Consent   Shared Decision Making/Informed Consent The risks [chest pain, shortness of breath, cardiac arrhythmias, dizziness, blood pressure fluctuations, myocardial infarction, stroke/transient ischemic attack, nausea, vomiting, allergic reaction, radiation exposure, metallic taste sensation and life-threatening complications (estimated to be 1 in 10,000)], benefits (risk stratification, diagnosing coronary artery disease, treatment guidance) and alternatives of a cardiac PET stress test were discussed in detail with Ms. Zaucha and she agrees to proceed.       Medication Adjustments/Labs and Tests Ordered: Current medicines are reviewed at length with the patient today.  Concerns regarding medicines are outlined above.  Orders Placed This Encounter  Procedures   NM PET CT CARDIAC PERFUSION MULTI  W/ABSOLUTE BLOODFLOW   LONG TERM MONITOR (3-14 DAYS)   EKG 12-Lead   ECHOCARDIOGRAM COMPLETE   No orders of the defined types were placed in this encounter.   Patient Instructions  Medication Instructions:  Your physician recommends that you continue on your current medications as directed. Please refer to the Current Medication list given to you today.  *If you need a refill on your cardiac medications before your next appointment, please call your pharmacy*  Lab Work: none If you have labs (blood work) drawn today and your tests are completely normal, you will receive your results only by: MyChart Message (if you have MyChart) OR A paper copy in the mail If you have any lab test that is abnormal or we need to change your treatment, we will call you to review the results.  Testing/Procedures: Your physician has requested that you have an echocardiogram. Echocardiography is a painless test that uses sound waves to create images of your heart. It provides your doctor with information about the size and shape of your heart and how well your heart's chambers and valves are working. This procedure takes approximately one hour. There are no restrictions for this procedure. Please do NOT wear cologne, perfume, aftershave, or lotions (deodorant is allowed). Please arrive 15 minutes prior to your appointment time.  Please note: We ask at that you not bring children with you during ultrasound (echo/ vascular) testing. Due to room size and safety concerns, children are not allowed in the ultrasound rooms during exams. Our front office staff cannot provide observation of children in our lobby area while testing is being conducted. An adult accompanying a patient to their appointment will only be allowed in the ultrasound room at the discretion of the ultrasound technician under special circumstances. We apologize for any inconvenience.  Dr Kate has requested you have a cardiac PET stress  test  Dr Kate has requested you wear a zio monitor for 2 weeks.   Follow-Up: At [facility], you and your health needs are our priority.  As part of our continuing mission to provide you  with exceptional heart care, our providers are all part of one team.  This team includes your primary Cardiologist (physician) and Advanced Practice Providers or APPs (Physician Assistants and Nurse Practitioners) who all work together to provide you with the care you need, when you need it.  Your next appointment:   4 month(s)  Provider:   Lonni LITTIE Nanas, MD    We recommend signing up for the patient portal called MyChart.  Sign up information is provided on this After Visit Summary.  MyChart is used to connect with patients for Virtual Visits (Telemedicine).  Patients are able to view lab/test results, encounter notes, upcoming appointments, etc.  Non-urgent messages can be sent to your provider as well.   To learn more about what you can do with MyChart, go to [email or web address].   Other Instructions         Please report to Radiology at the [facility] Main Entrance 30 minutes early for your test.  [address] [place], [place] [address]                         OR   Please report to Radiology at [facility] Main Entrance, medical mall, 30 mins prior to your test.  [address]  [place], [place]  How to Prepare for Your Cardiac PET/CT Stress Test:  Nothing to eat or drink, except water, 3 hours prior to arrival time.  NO caffeine/decaffeinated products, or chocolate 12 hours prior to arrival. (Please note decaffeinated beverages (teas/coffees) still contain caffeine).  If you have caffeine within 12 hours prior, the test will need to be rescheduled.  Medication instructions: Do not take erectile dysfunction medications for 72 hours prior to test (sildenafil, tadalafil) Do not take nitrates (isosorbide mononitrate, Ranexa)  the day before or day of test Do not take tamsulosin the day before or morning of test Hold theophylline containing medications for 12 hours. Hold Dipyridamole 48 hours prior to the test.  Diabetic Preparation: If able to eat breakfast prior to 3 hour fasting, you may take all medications, including your insulin . Do not worry if you miss your breakfast dose of insulin  - start at your next meal. If you do not eat prior to 3 hour fast-Hold all diabetes (oral and insulin ) medications. Patients who wear a continuous glucose monitor MUST remove the device prior to scanning.  You may take your remaining medications with water.  NO perfume, cologne or lotion on chest or abdomen area. FEMALES - Please avoid wearing dresses to this appointment.  Total time is 1 to 2 hours; you may want to bring reading material for the waiting time.  IF YOU THINK YOU MAY BE PREGNANT, OR ARE NURSING PLEASE INFORM THE TECHNOLOGIST.  In preparation for your appointment, medication and supplies will be purchased.  Appointment availability is limited, so if you need to cancel or reschedule, please call the Radiology Department Scheduler at [phone] 24 hours in advance to avoid a cancellation fee of $100.00  What to Expect When you Arrive:  Once you arrive and check in for your appointment, you will be taken to a preparation room within the Radiology Department.  A technologist or Nurse will obtain your medical history, verify that you are correctly prepped for the exam, and explain the procedure.  Afterwards, an IV will be started in your arm and electrodes will be placed on your skin for EKG monitoring during the stress portion of  the exam. Then you will be escorted to the PET/CT scanner.  There, staff will get you positioned on the scanner and obtain a blood pressure and EKG.  During the exam, you will continue to be connected to the EKG and blood pressure machines.  A small, safe amount of a radioactive tracer will  be injected in your IV to obtain a series of pictures of your heart along with an injection of a stress agent.    After your Exam:  It is recommended that you eat a meal and drink a caffeinated beverage to counter act any effects of the stress agent.  Drink plenty of fluids for the remainder of the day and urinate frequently for the first couple of hours after the exam.  Your doctor will inform you of your test results within 7-10 business days.  For more information and frequently asked questions, please visit our website: [email or web address]  For questions about your test or how to prepare for your test, please call: Cardiac Imaging Nurse Navigators Office: [phone]  ZIO XT- Long Term Monitor Instructions  Your physician has requested you wear a ZIO patch monitor for 14 days.  This is a single patch monitor. [employer] supplies one patch monitor per enrollment. Additional stickers are not available. Please do not apply patch if you will be having a Nuclear Stress Test,  Echocardiogram, Cardiac CT, MRI, or Chest Xray during the period you would be wearing the  monitor. The patch cannot be worn during these tests. You cannot remove and re-apply the  ZIO XT patch monitor.  Your ZIO patch monitor will be mailed 3 day USPS to your address on file. It may take 3-5 days  to receive your monitor after you have been enrolled.  Once you have received your monitor, please review the enclosed instructions. Your monitor  has already been registered assigning a specific monitor serial # to you.  Billing and Patient Assistance Program Information  We have supplied [employer] with any of your insurance information on file for billing purposes. [employer] offers a sliding scale Patient Assistance Program for patients that do not have  insurance, or whose insurance does not completely cover the cost of the ZIO monitor.  You must apply for the Patient Assistance Program to qualify for this  discounted rate.  To apply, please call [employer] at [phone], select option 4, select option 2, ask to apply for  Patient Assistance Program. Meredeth will ask your household income, and how many people  are in your household. They will quote your out-of-pocket cost based on that information.  [employer] will also be able to set up a 7-month, interest-free payment plan if needed.  Applying the monitor   Shave hair from upper left chest.  Hold abrader disc by orange tab. Rub abrader in 40 strokes over the upper left chest as  indicated in your monitor instructions.  Clean area with 4 enclosed alcohol pads. Let dry.  Apply patch as indicated in monitor instructions. Patch will be placed under collarbone on left  side of chest with arrow pointing upward.  Rub patch adhesive wings for 2 minutes. Remove white label marked 1. Remove the white  label marked 2. Rub patch adhesive wings for 2 additional minutes.  While looking in a mirror, press and release button in center of patch. A small green light will  flash 3-4 times. This will be your only indicator that the monitor has been turned on.  Do not shower for the first 24  hours. You may shower after the first 24 hours.  Press the button if you feel a symptom. You will hear a small click. Record Date, Time and  Symptom in the Patient Logbook.  When you are ready to remove the patch, follow instructions on the last 2 pages of Patient  Logbook. Stick patch monitor onto the last page of Patient Logbook.  Place Patient Logbook in the blue and white box. Use locking tab on box and tape box closed  securely. The blue and white box has prepaid postage on it. Please place it in the mailbox as  soon as possible. Your physician should have your test results approximately 7 days after the  monitor has been mailed back to [facility].  Call [facility] Customer Care at [phone] if you have questions regarding  your ZIO XT patch monitor. Call  them immediately if you see an orange light blinking on your  monitor.  If your monitor falls off in less than 4 days, contact our Monitor department at [phone].  If your monitor becomes loose or falls off after 4 days call [employer] at [phone] for  suggestions on securing your monitor       Signed, Lonni LITTIE Nanas, MD  08/29/2024 5:06 PM    [facility]

## 2024-08-29 ENCOUNTER — Encounter: Payer: Self-pay | Admitting: Cardiology

## 2024-08-29 ENCOUNTER — Ambulatory Visit: Attending: Cardiology

## 2024-08-29 ENCOUNTER — Ambulatory Visit: Attending: Cardiology | Admitting: Cardiology

## 2024-08-29 VITALS — BP 111/81 | HR 82 | Ht 64.0 in | Wt 296.0 lb

## 2024-08-29 DIAGNOSIS — Z136 Encounter for screening for cardiovascular disorders: Secondary | ICD-10-CM

## 2024-08-29 DIAGNOSIS — R002 Palpitations: Secondary | ICD-10-CM

## 2024-08-29 DIAGNOSIS — R0609 Other forms of dyspnea: Secondary | ICD-10-CM | POA: Diagnosis not present

## 2024-08-29 DIAGNOSIS — R079 Chest pain, unspecified: Secondary | ICD-10-CM | POA: Diagnosis not present

## 2024-08-29 DIAGNOSIS — E785 Hyperlipidemia, unspecified: Secondary | ICD-10-CM

## 2024-08-29 NOTE — Patient Instructions (Addendum)
 Medication Instructions:  Your physician recommends that you continue on your current medications as directed. Please refer to the Current Medication list given to you today.  *If you need a refill on your cardiac medications before your next appointment, please call your pharmacy*  Lab Work: none If you have labs (blood work) drawn today and your tests are completely normal, you will receive your results only by: MyChart Message (if you have MyChart) OR A paper copy in the mail If you have any lab test that is abnormal or we need to change your treatment, we will call you to review the results.  Testing/Procedures: Your physician has requested that you have an echocardiogram. Echocardiography is a painless test that uses sound waves to create images of your heart. It provides your doctor with information about the size and shape of your heart and how well your heart's chambers and valves are working. This procedure takes approximately one hour. There are no restrictions for this procedure. Please do NOT wear cologne, perfume, aftershave, or lotions (deodorant is allowed). Please arrive 15 minutes prior to your appointment time.  Please note: We ask at that you not bring children with you during ultrasound (echo/ vascular) testing. Due to room size and safety concerns, children are not allowed in the ultrasound rooms during exams. Our front office staff cannot provide observation of children in our lobby area while testing is being conducted. An adult accompanying a patient to their appointment will only be allowed in the ultrasound room at the discretion of the ultrasound technician under special circumstances. We apologize for any inconvenience.  Dr Kate has requested you have a cardiac PET stress test  Dr Kate has requested you wear a zio monitor for 2 weeks.   Follow-Up: At [facility], you and your health needs are our priority.  As part of our continuing mission to  provide you with exceptional heart care, our providers are all part of one team.  This team includes your primary Cardiologist (physician) and Advanced Practice Providers or APPs (Physician Assistants and Nurse Practitioners) who all work together to provide you with the care you need, when you need it.  Your next appointment:   4 month(s)  Provider:   Lonni LITTIE Kate, MD    We recommend signing up for the patient portal called MyChart.  Sign up information is provided on this After Visit Summary.  MyChart is used to connect with patients for Virtual Visits (Telemedicine).  Patients are able to view lab/test results, encounter notes, upcoming appointments, etc.  Non-urgent messages can be sent to your provider as well.   To learn more about what you can do with MyChart, go to [email or web address].   Other Instructions         Please report to Radiology at the [facility] Main Entrance 30 minutes early for your test.  [address] [place], [place] [address]                         OR   Please report to Radiology at [facility] Main Entrance, medical mall, 30 mins prior to your test.  [address]  [place], [place]  How to Prepare for Your Cardiac PET/CT Stress Test:  Nothing to eat or drink, except water, 3 hours prior to arrival time.  NO caffeine/decaffeinated products, or chocolate 12 hours prior to arrival. (Please note decaffeinated beverages (teas/coffees) still contain caffeine).  If  you have caffeine within 12 hours prior, the test will need to be rescheduled.  Medication instructions: Do not take erectile dysfunction medications for 72 hours prior to test (sildenafil, tadalafil) Do not take nitrates (isosorbide mononitrate, Ranexa) the day before or day of test Do not take tamsulosin the day before or morning of test Hold theophylline containing medications for 12 hours. Hold Dipyridamole 48 hours prior to the  test.  Diabetic Preparation: If able to eat breakfast prior to 3 hour fasting, you may take all medications, including your insulin . Do not worry if you miss your breakfast dose of insulin  - start at your next meal. If you do not eat prior to 3 hour fast-Hold all diabetes (oral and insulin ) medications. Patients who wear a continuous glucose monitor MUST remove the device prior to scanning.  You may take your remaining medications with water.  NO perfume, cologne or lotion on chest or abdomen area. FEMALES - Please avoid wearing dresses to this appointment.  Total time is 1 to 2 hours; you may want to bring reading material for the waiting time.  IF YOU THINK YOU MAY BE PREGNANT, OR ARE NURSING PLEASE INFORM THE TECHNOLOGIST.  In preparation for your appointment, medication and supplies will be purchased.  Appointment availability is limited, so if you need to cancel or reschedule, please call the Radiology Department Scheduler at [phone] 24 hours in advance to avoid a cancellation fee of $100.00  What to Expect When you Arrive:  Once you arrive and check in for your appointment, you will be taken to a preparation room within the Radiology Department.  A technologist or Nurse will obtain your medical history, verify that you are correctly prepped for the exam, and explain the procedure.  Afterwards, an IV will be started in your arm and electrodes will be placed on your skin for EKG monitoring during the stress portion of the exam. Then you will be escorted to the PET/CT scanner.  There, staff will get you positioned on the scanner and obtain a blood pressure and EKG.  During the exam, you will continue to be connected to the EKG and blood pressure machines.  A small, safe amount of a radioactive tracer will be injected in your IV to obtain a series of pictures of your heart along with an injection of a stress agent.    After your Exam:  It is recommended that you eat a meal and drink a  caffeinated beverage to counter act any effects of the stress agent.  Drink plenty of fluids for the remainder of the day and urinate frequently for the first couple of hours after the exam.  Your doctor will inform you of your test results within 7-10 business days.  For more information and frequently asked questions, please visit our website: [email or web address]  For questions about your test or how to prepare for your test, please call: Cardiac Imaging Nurse Navigators Office: [phone]  ZIO XT- Long Term Monitor Instructions  Your physician has requested you wear a ZIO patch monitor for 14 days.  This is a single patch monitor. [employer] supplies one patch monitor per enrollment. Additional stickers are not available. Please do not apply patch if you will be having a Nuclear Stress Test,  Echocardiogram, Cardiac CT, MRI, or Chest Xray during the period you would be wearing the  monitor. The patch cannot be worn during these tests. You cannot remove and re-apply the  ZIO XT patch monitor.  Your ZIO patch monitor will  be mailed 3 day USPS to your address on file. It may take 3-5 days  to receive your monitor after you have been enrolled.  Once you have received your monitor, please review the enclosed instructions. Your monitor  has already been registered assigning a specific monitor serial # to you.  Billing and Patient Assistance Program Information  We have supplied [employer] with any of your insurance information on file for billing purposes. [employer] offers a sliding scale Patient Assistance Program for patients that do not have  insurance, or whose insurance does not completely cover the cost of the ZIO monitor.  You must apply for the Patient Assistance Program to qualify for this discounted rate.  To apply, please call [employer] at [phone], select option 4, select option 2, ask to apply for  Patient Assistance Program. Meredeth will ask your household income,  and how many people  are in your household. They will quote your out-of-pocket cost based on that information.  [employer] will also be able to set up a 49-month, interest-free payment plan if needed.  Applying the monitor   Shave hair from upper left chest.  Hold abrader disc by orange tab. Rub abrader in 40 strokes over the upper left chest as  indicated in your monitor instructions.  Clean area with 4 enclosed alcohol pads. Let dry.  Apply patch as indicated in monitor instructions. Patch will be placed under collarbone on left  side of chest with arrow pointing upward.  Rub patch adhesive wings for 2 minutes. Remove white label marked 1. Remove the white  label marked 2. Rub patch adhesive wings for 2 additional minutes.  While looking in a mirror, press and release button in center of patch. A small green light will  flash 3-4 times. This will be your only indicator that the monitor has been turned on.  Do not shower for the first 24 hours. You may shower after the first 24 hours.  Press the button if you feel a symptom. You will hear a small click. Record Date, Time and  Symptom in the Patient Logbook.  When you are ready to remove the patch, follow instructions on the last 2 pages of Patient  Logbook. Stick patch monitor onto the last page of Patient Logbook.  Place Patient Logbook in the blue and white box. Use locking tab on box and tape box closed  securely. The blue and white box has prepaid postage on it. Please place it in the mailbox as  soon as possible. Your physician should have your test results approximately 7 days after the  monitor has been mailed back to [facility].  Call [facility] Customer Care at [phone] if you have questions regarding  your ZIO XT patch monitor. Call them immediately if you see an orange light blinking on your  monitor.  If your monitor falls off in less than 4 days, contact our Monitor department at [phone].  If your monitor  becomes loose or falls off after 4 days call [employer] at [phone] for  suggestions on securing your monitor

## 2024-08-29 NOTE — Progress Notes (Unsigned)
 Enrolled patient for a 14 day Zio XT  monitor to be mailed to patients home

## 2024-08-31 ENCOUNTER — Ambulatory Visit (INDEPENDENT_AMBULATORY_CARE_PROVIDER_SITE_OTHER): Admitting: Licensed Clinical Social Worker

## 2024-08-31 DIAGNOSIS — F4320 Adjustment disorder, unspecified: Secondary | ICD-10-CM | POA: Diagnosis not present
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
## 2024-09-04 ENCOUNTER — Encounter: Payer: Self-pay | Admitting: Radiology

## 2024-09-07 ENCOUNTER — Ambulatory Visit (HOSPITAL_COMMUNITY)

## 2024-09-15 ENCOUNTER — Encounter (HOSPITAL_COMMUNITY): Payer: Self-pay

## 2024-09-18 ENCOUNTER — Telehealth (HOSPITAL_COMMUNITY): Payer: Self-pay | Admitting: *Deleted

## 2024-09-18 NOTE — Telephone Encounter (Signed)
Attempted to call patient regarding upcoming cardiac PET appointment. Unable to leave a message.  Larey Brick RN Navigator [facility] [phone] Office [phone] Cell

## 2024-09-19 ENCOUNTER — Ambulatory Visit (HOSPITAL_COMMUNITY): Admission: RE | Admit: 2024-09-19 | Source: Ambulatory Visit

## 2024-10-03 ENCOUNTER — Telehealth: Payer: Self-pay | Admitting: Cardiology

## 2024-10-03 NOTE — Telephone Encounter (Signed)
*  STAT* If patient is at the pharmacy, call can be transferred to refill team.   1. Which medications need to be refilled? (please list name of each medication and dose if known) Lifitegrast 5 % SOLN  olopatadine (PATANOL) 0.1 % ophthalmic solution   cholecalciferol (VITAMIN D3) 25 MCG (1000 UNIT) tablet    aspirin  EC 81 MG tablet   2. Would you like to learn more about the convenience, safety, & potential cost savings by using the [facility]? No    3. Are you open to using the [facility] (Type [facility]. No    4. Which pharmacy/location (including street and city if local pharmacy) is medication to be sent to?[facility] [place] [address]    5. Do they need a 30 day or 90 day supply? 30 day

## 2024-10-04 NOTE — Telephone Encounter (Signed)
 Unable to LVM to let pt know that she will need to contact pcp for requested non cardiac medications. Pt's VM is full at this time.

## 2024-10-10 ENCOUNTER — Other Ambulatory Visit: Payer: Self-pay

## 2024-10-10 DIAGNOSIS — Z720 Tobacco use: Secondary | ICD-10-CM

## 2024-10-10 DIAGNOSIS — K219 Gastro-esophageal reflux disease without esophagitis: Secondary | ICD-10-CM

## 2024-10-10 DIAGNOSIS — E785 Hyperlipidemia, unspecified: Secondary | ICD-10-CM

## 2024-10-12 MED ORDER — CARVEDILOL 25 MG PO TABS: 12.5000 mg | ORAL_TABLET | Freq: Two times a day (BID) | ORAL | 3 refills | Status: AC

## 2024-10-12 MED ORDER — ATORVASTATIN CALCIUM 20 MG PO TABS: 20.0000 mg | ORAL_TABLET | Freq: Every day | ORAL | 3 refills | Status: AC

## 2024-10-17 ENCOUNTER — Ambulatory Visit (HOSPITAL_COMMUNITY)

## 2024-10-17 ENCOUNTER — Telehealth (HOSPITAL_COMMUNITY): Payer: Self-pay | Admitting: Cardiology

## 2024-10-17 ENCOUNTER — Encounter (HOSPITAL_COMMUNITY): Payer: Self-pay | Admitting: Cardiology

## 2024-10-17 NOTE — Telephone Encounter (Signed)
 Patient  NO SHOWED ECHO  10/17/24 - we will not reach out to reschedule due to NO SHOW RATE of 16%. If patient calls back we will reinstate the order and schedule. Thank you.

## 2024-10-22 ENCOUNTER — Ambulatory Visit: Payer: Self-pay | Admitting: Cardiology

## 2024-10-22 DIAGNOSIS — R002 Palpitations: Secondary | ICD-10-CM | POA: Diagnosis not present

## 2024-10-24 ENCOUNTER — Other Ambulatory Visit: Payer: Self-pay | Admitting: Family Medicine

## 2024-10-24 DIAGNOSIS — B350 Tinea barbae and tinea capitis: Secondary | ICD-10-CM

## 2024-10-24 DIAGNOSIS — R21 Rash and other nonspecific skin eruption: Secondary | ICD-10-CM

## 2024-10-24 DIAGNOSIS — Z720 Tobacco use: Secondary | ICD-10-CM

## 2024-10-24 DIAGNOSIS — K219 Gastro-esophageal reflux disease without esophagitis: Secondary | ICD-10-CM

## 2024-10-24 DIAGNOSIS — J3089 Other allergic rhinitis: Secondary | ICD-10-CM

## 2024-10-24 NOTE — Telephone Encounter (Signed)
 Copied from CRM [id number]. Topic: Clinical - Medication Refill >> Oct 24, 2024  2:55 PM Shanda MATSU wrote: Medication:  albuterol  (VENTOLIN  HFA) 108 (90 Base) MCG/ACT inhaler triamcinolone  cream (KENALOG ) 0.1 %  montelukast  (SINGULAIR ) 10 MG tablet terbinafine  (LAMISIL ) 250 MG tablet  olopatadine (PATANOL) 0.1 % ophthalmic solution sertraline  (ZOLOFT ) 100 MG tablet carboxymethylcellulose (REFRESH PLUS) 0.5 % SOLN  cyclobenzaprine  (FLEXERIL ) 10 MG tablet pantoprazole  (PROTONIX ) 40 MG tablet levETIRAcetam  (KEPPRA ) 500 MG tablet  Has the patient contacted their pharmacy? Yes, pharmacy calling on behalf of patient (Agent: If no, request that the patient contact the pharmacy for the refill. If patient does not wish to contact the pharmacy document the reason why and proceed with request.) (Agent: If yes, when and what did the pharmacy advise?)  This is the patient's preferred pharmacy:  [facility] - [place], [place] - [address] [place] [address] Phone: [phone] Fax: [phone]  Is this the correct pharmacy for this prescription? Yes If no, delete pharmacy and type the correct one.   Has the prescription been filled recently? No  Is the patient out of the medication? No  Has the patient been seen for an appointment in the last year OR does the patient have an upcoming appointment? No  Can we respond through MyChart? No  Agent: Please be advised that Rx refills may take up to 3 business days. We ask that you follow-up with your pharmacy.

## 2024-10-25 NOTE — Telephone Encounter (Signed)
 Requested medication (s) are due for refill today: Yes  Requested medication (s) are on the active medication list: Yes  Last refill:  2022  Future visit scheduled: No  Notes to clinic:  Unable to refuse due to non-delegated medicine, patient no longer under prescriber care. Routing to office for refusal.       Requested Prescriptions  Pending Prescriptions Disp Refills   triamcinolone  cream (KENALOG ) 0.1 % 30 g 1    Sig: Apply 1 Application topically 2 (two) times daily.     Not Delegated - Dermatology:  Corticosteroids Failed - 10/25/2024 12:34 PM      Failed - This refill cannot be delegated      Failed - Valid encounter within last 12 months    Recent Outpatient Visits           3 years ago Tinea capitis   [place] [facility] - A Dept Of [place]. [facility] Delbert Clam, MD   3 years ago Dizziness   [place] [facility] [place] - A Dept Of [place]. [facility], [place], [place]   3 years ago History of seizures   [place] [facility] Wall[facility] - A Dept Of [place]. [facility] Theotis Haze ORN, NP   4 years ago Hair loss   [place] [facility] [place] - A Dept Of [place]. [facility], [place], [place]   4 years ago Small fiber neuropathy   [place] [facility] L[facility] - A Dept Of C[place]. [facility] Theotis Haze ORN, NP       Future Appointments             In 2 months Kate Lonni CROME, MD [facility] Heart[facility] at [facility] A Dept of The [place]. [facility], [facility]             terbinafine  (LAMISIL ) 250 MG tablet 28 tablet 0    Sig: Take 1 tablet (250 mg total) by mouth daily.     Off-Protocol Failed - 10/25/2024 12:34 PM      Failed - Medication not assigned to a protocol, review manually.      Failed - Valid encounter within last 12 months    Recent Outpatient Visits           3 years ago Tinea capitis   [place] [facility] - A Dept Of [place]. [facility] Delbert Clam, MD   3 years ago Dizziness   [place] [facility] - A Dept Of [place]. [facility] [place], [place], [place]   3 years ago History of seizures   [place] [facility] Cap[facility] - A Dept Of [place]. [facility] Theotis Haze ORN, NP   4 years ago Hair loss   [place] [facility] McGuf[facility] - A Dept Of [place]. [facility], [place], [place]   4 years ago Small fiber neuropathy    [facility] [place] - A Dept Of . [facility] Theotis Haze ORN, NP       Future Appointments             In 2 months Kate Lonni CROME, MD [facility] at [facility] A Dept of The [place]. [facility], [facility]             cyclobenzaprine  (FLEXERIL ) 10 MG tablet 30  tablet 1    Sig: Take 1 tablet (10 mg total) by mouth 2 (two) times daily as needed for muscle spasms.     Not Delegated - Analgesics:  Muscle Relaxants Failed - 10/25/2024 12:34 PM      Failed - This refill cannot be delegated      Failed - Valid encounter within last 6 months    Recent Outpatient Visits           3 years ago Tinea capitis   [place] [facility] - A Dept Of [place]. [facility] Delbert Clam, MD   3 years ago Dizziness   [place] [facility] - A Dept Of [place]. [facility] [place], [place], [place]   3 years ago History of seizures   [place] [facility] Protection - A Dept Of [place]. [facility] Theotis Haze ORN, NP   4 years ago Hair loss   [place] [facility] - A Dept Of [place]. [facility], [place], [place]   4 years ago Small fiber neuropathy   [place] [facility] [place] - A Dept Of [place]. [facility] Theotis Haze ORN, NP       Future Appointments             In 2 months Kate Lonni CROME, MD [facility] Heart[facility] at [facility] A Dept of The Congress. [facility], [facility]            Refused  Prescriptions Disp Refills   albuterol  (VENTOLIN  HFA) 108 (90 Base) MCG/ACT inhaler 8 g 2    Sig: Inhale 2 puffs into the lungs every 6 (six) hours as needed for wheezing or shortness of breath.     Pulmonology:  Beta Agonists 2 Failed - 10/25/2024 12:34 PM      Failed - Valid encounter within last 12 months    Recent Outpatient Visits           3 years ago Tinea capitis   [place] [facility] - A Dept Of [place]. [facility] Delbert Clam, MD   3 years ago Dizziness   [place] [facility] [place] - A Dept Of [place]. [facility] [place], [place], [place]   3 years ago History of seizures   [place] [facility] [place] - A Dept Of [place]. [facility] Theotis Haze ORN, NP   4 years ago Hair loss   [place] [facility] [place] - A Dept Of [place]. [facility], [place], [place]   4 years ago Small fiber neuropathy   [place] [facility] [place] - A Dept Of . [facility] Theotis Haze ORN, NP       Future Appointments             In 2 months Kate Lonni CROME, MD [facility] at [facility] A Dept of The [place]. [facility], [facility]            Passed - Last BP in normal range    BP Readings from Last 1 Encounters:  08/29/24 111/81         Passed - Last Heart Rate in normal range    Pulse Readings from Last 1 Encounters:  08/29/24 82          montelukast  (SINGULAIR ) 10 MG tablet 90 tablet 1  Sig: Take 1 tablet (10 mg total) by mouth at bedtime.     Pulmonology:  Leukotriene Inhibitors Failed - 10/25/2024 12:34 PM      Failed - Valid encounter within last 12 months    Recent Outpatient Visits           3 years ago Tinea capitis   [place] [facility] - A Dept Of [place]. [facility] Delbert Clam, MD   3 years ago Dizziness   [place] [facility] [place] - A Dept Of [place]. [facility] [place], [place], [place]   3 years ago History of  seizures   [place] [facility] [place] - A Dept Of [place]. [facility] Theotis Haze ORN, NP   4 years ago Hair loss   [place] [facility] [place] - A Dept Of [place]. [facility], [place], [place]   4 years ago Small fiber neuropathy   [place] [facility] [place] - A Dept Of Hinds. [facility] Theotis Haze ORN, NP       Future Appointments             In 2 months Kate Lonni CROME, MD [facility] HeartCare at [facility] A Dept of The [place]. [facility], [facility]             olopatadine (PATANOL) 0.1 % ophthalmic solution 5 mL     Sig: Place 1 drop into both eyes 2 (two) times daily.     Ophthalmology:  Antiallergy Failed - 10/25/2024 12:34 PM      Failed - Valid encounter within last 12 months    Recent Outpatient Visits           3 years ago Tinea capitis   [place] [facility] - A Dept Of [place]. [facility] Delbert Clam, MD   3 years ago Dizziness   [place] [facility] Le[facility]worth - A Dept Of [place]. [facility] [place], [place], [place]   3 years ago History of seizures   [place] [facility] Vera - A Dept Of [place]. [facility] Theotis Haze ORN, NP   4 years ago Hair loss   [place] [facility] Merigold - A Dept Of [place]. [facility] [place], [place], [place]   4 years ago Small fiber neuropathy    Comm [facility] [place] - A Dept Of [place]. [facility] Theotis Haze ORN, NP       Future Appointments             In 2 months Kate Lonni CROME, MD [facility] Heart[facility] at [facility] A Dept of The [place]. [facility], [facility]             sertraline  (ZOLOFT ) 100 MG tablet      Sig: Take 2 tablets (200 mg total) by mouth daily.     Psychiatry:  Antidepressants - SSRI - sertraline  Failed - 10/25/2024 12:34 PM      Failed - AST in normal range and within 360 days    AST  Date Value Ref Range Status  11/25/2020 66 (H) 15 - 41 U/L Final          Failed - ALT in normal range and within 360 days    ALT  Date Value Ref Range Status  11/25/2020 49 (H) 0 - 44 U/L Final  Failed - Valid encounter within last 6 months    Recent Outpatient Visits           3 years ago Tinea capitis   [place] [facility] - A Dept Of [place]. [facility] Delbert Clam, MD   3 years ago Dizziness   [place] [facility] Pit[place] - A Dept Of [place]. [facility] [place], [place], [place]   3 years ago History of seizures   [place] [facility] Dyne - A Dept Of Roll[place]. [facility] Theotis Haze ORN, NP   4 years ago Hair loss   [place] [facility] Foster - A Dept Of [place]. [facility], [place], [place]   4 years ago Small fiber neuropathy   [place] [facility] Judson[place] - A Dept Of [place]. [facility] Theotis Haze ORN, NP       Future Appointments             In 2 months Kate Lonni CROME, MD [facility] HeartCare at [facility] A Dept of The [place]. [facility], [facility]            Passed - Completed PHQ-2 or PHQ-9 in the last 360 days       carboxymethylcellulose (REFRESH PLUS) 0.5 % SOLN      Sig: Place 2 drops into both eyes in the morning and at bedtime.     Ophthalmology:  Lubricants Failed - 10/25/2024 12:34 PM      Failed - Valid encounter within last 12 months    Recent Outpatient Visits           3 years ago Tinea capitis   [place] [facility] - A Dept Of [place]. [facility] Delbert Clam, MD   3 years ago Dizziness   [place] [facility] [place] - A Dept Of [place]. [facility] [place], [place], [place]   3 years ago History of seizures   [place] [facility] [place] - A Dept Of [place]. [facility] Theotis Haze ORN, NP   4 years ago Hair loss   [place] [facility] Morg[place] - A Dept Of [place]. [facility], [place], [place]   4 years ago Small fiber neuropathy    [place] [facility] [place] - A Dept Of [place]. [facility] Theotis Haze ORN, NP       Future Appointments             In 2 months Kate Lonni CROME, MD [facility] Heart[facility] at [facility] A Dept of The [place]. [facility], [facility]             pantoprazole  (PROTONIX ) 40 MG tablet 90 tablet 1    Sig: Take 1 tablet (40 mg total) by mouth daily.     Gastroenterology: Proton Pump Inhibitors Failed - 10/25/2024 12:34 PM      Failed - Valid encounter within last 12 months    Recent Outpatient Visits           3 years ago Tinea capitis   [place] [facility] - A Dept Of [place]. [facility] Delbert Clam, MD   3 years ago Dizziness   [place] [facility] Monserr[place] - A Dept Of . [facility] [place], [place], [place]   3 years ago History of  seizures   [place] [facility] [place] - A Dept Of [place]. [facility] Theotis Haze ORN, NP   4 years ago Hair loss   [place] [facility] - A Dept Of [place]. [facility], [place], [place]   4 years ago Small fiber neuropathy   [place] [facility] [place] - A Dept Of [place]. [facility] Theotis Haze ORN, NP       Future Appointments             In 2 months Kate Lonni CROME, MD [facility] at [facility] A Dept of The [place]. [facility], [facility]             levETIRAcetam  (KEPPRA ) 500 MG tablet      Sig: Take 1 tablet (500 mg total) by mouth in the morning, at noon, and at bedtime.     Neurology:  Anticonvulsants - levetiracetam  Failed - 10/25/2024 12:34 PM      Failed - Cr in normal range and within 360 days    Creatinine, Ser  Date Value Ref Range Status  06/30/2022 1.08 (H) 0.44 - 1.00 mg/dL Final         Failed - Valid encounter within last 12 months    Recent Outpatient Visits           3 years ago Tinea capitis   [place] [facility] - A Dept Of [place]. [facility] Delbert Clam, MD   3  years ago Dizziness   [place] [facility] [place]ell[place] - A Dept Of [place]. [facility] [place], [place], [place]   3 years ago History of seizures   [place] [facility] Elm[place]f - A Dept Of [place]. [facility] Theotis Haze ORN, NP   4 years ago Hair loss   [place] [facility] [place] - A Dept Of [place]. [facility], [place], [place]   4 years ago Small fiber neuropathy    Comm [facility] [place] - A Dept Of [place]. [facility] Theotis Haze ORN, NP       Future Appointments             In 2 months Kate Lonni CROME, MD [facility] HeartCare at [facility] A Dept of The [place]. [facility], [facility]            Passed - Completed PHQ-2 or PHQ-9 in the last 360 days

## 2024-10-25 NOTE — Telephone Encounter (Signed)
 No longer under prescriber care, will refuse due to this.  Requested Prescriptions  Pending Prescriptions Disp Refills   triamcinolone  cream (KENALOG ) 0.1 % 30 g 1    Sig: Apply 1 Application topically 2 (two) times daily.     Not Delegated - Dermatology:  Corticosteroids Failed - 10/25/2024 12:33 PM      Failed - This refill cannot be delegated      Failed - Valid encounter within last 12 months    Recent Outpatient Visits           3 years ago Tinea capitis   [facility] - A Dept Of [place]. [facility] Delbert Clam, MD   3 years ago Dizziness   [place] [facility] Bern[facility] - A Dept Of Y[place]. [facility] [place], [place], [place]   3 years ago History of seizures   [facility] Colton - A Dept Of [place]. [facility] Theotis Haze ORN, NP   4 years ago Hair loss   [facility] Yo[facility]um - A Dept Of [place]. [facility] [place], [place], [place]   4 years ago Small fiber neuropathy   [facility] Ruston - A Dept Of [place]. [facility] Theotis Haze ORN, NP       Future Appointments             In 2 months Kate Lonni CROME, MD [facility] Heart[facility] at [facility] A Dept of The [place]. [facility], H&V             terbinafine  (LAMISIL ) 250 MG tablet 28 tablet 0    Sig: Take 1 tablet (250 mg total) by mouth daily.     Off-Protocol Failed - 10/25/2024 12:33 PM      Failed - Medication not assigned to a protocol, review manually.      Failed - Valid encounter within last 12 months    Recent Outpatient Visits           3 years ago Tinea capitis   [facility] - A Dept Of Yard[place]. [facility] Delbert Clam, MD   3 years ago Dizziness   [facility] Sal[facility] - A Dept Of [place]. [facility] [place], [place], [place]   3 years ago History of seizures   [facility] [facility] Bentley[facility] - A Dept Of Sutter [place]. [facility] Theotis Haze ORN, NP   4 years ago Hair loss   [facility] Arnett - A Dept Of M[place]. [facility], [place], [place]   4 years ago Small fiber neuropathy   [facility] North River - A Dept Of [place] Place. [facility] Theotis Haze ORN, NP       Future Appointments             In 2 months Kate Lonni CROME, MD [facility] A Dept of The [place]. [facility], H&V             cyclobenzaprine  (FLEXERIL ) 10 MG tablet 30 tablet 1    Sig: Take 1 tablet (10 mg total) by mouth 2 (two) times daily as needed for muscle spasms.     Not Delegated - Analgesics:  Muscle Relaxants Failed - 10/25/2024 12:33 PM      Failed - This refill  cannot be delegated      Failed - Valid encounter within last 6 months    Recent Outpatient Visits           3 years ago Tinea capitis   [place] [facility] - A Dept Of [place]. [facility] Delbert Clam, MD   3 years ago Dizziness   [place] [facility] L[facility] - A Dept Of C[place]. [facility] [place], [place], [place]   3 years ago History of seizures   [place] [facility] Ray[facility] - A Dept Of R[place]. [facility] Theotis Haze ORN, NP   4 years ago Hair loss   [facility] Vanoss - A Dept Of [place]. [facility], [place], [place]   4 years ago Small fiber neuropathy   [facility] Pine - A Dept Of Bayou [place]. [facility] Theotis Haze ORN, NP       Future Appointments             In 2 months Kate Lonni CROME, MD [facility] Heart[facility] at [facility] A Dept of The Col[place]. [facility], H&V             levETIRAcetam  (KEPPRA ) 500 MG tablet      Sig: Take 1 tablet (500 mg total) by mouth in the morning, at noon, and at bedtime.     Neurology:  Anticonvulsants - levetiracetam  Failed - 10/25/2024 12:33 PM      Failed - Cr in normal range and within 360 days    Creatinine, Ser   Date Value Ref Range Status  06/30/2022 1.08 (H) 0.44 - 1.00 mg/dL Final         Failed - Valid encounter within last 12 months    Recent Outpatient Visits           3 years ago Tinea capitis   [facility] [facility] - A Dept Of [place]. [facility] Delbert Clam, MD   3 years ago Dizziness   [place] [facility] La Palma - A Dept Of [place]. [facility] [place], [place], [place]   3 years ago History of seizures   [facility] [facility] Cove Forge - A Dept Of [place]. [facility] Theotis Haze ORN, NP   4 years ago Hair loss   [facility] [facility] Lionville - A Dept Of [place]. [facility], [place], [place]   4 years ago Small fiber neuropathy   [place] [facility] Salt[place] - A Dept Of [place]. [facility] Theotis Haze ORN, NP       Future Appointments             In 2 months Kate Lonni CROME, MD [facility] Heart[facility] at [facility] A Dept of The [place]. [facility], H&V            Passed - Completed PHQ-2 or PHQ-9 in the last 360 days      Refused Prescriptions Disp Refills   albuterol  (VENTOLIN  HFA) 108 (90 Base) MCG/ACT inhaler 8 g 2    Sig: Inhale 2 puffs into the lungs every 6 (six) hours as needed for wheezing or shortness of breath.     Pulmonology:  Beta Agonists 2 Failed - 10/25/2024 12:33 PM      Failed - Valid encounter within last 12 months    Recent  Outpatient Visits           3 years ago Tinea capitis   [facility] [place] - A Dept Of [place]res. [facility] Delbert Clam, MD   3 years ago Dizziness   [place] Exline - A Dept Of [place]co. [facility] [place], [place], [place]   3 years ago History of seizures   [facility] Yale - A Dept Of [place]. [facility] Theotis Haze ORN, NP   4 years ago Hair loss   [facility] [facility] Fitchburg - A Dept Of [place]. [facility], [place], [place]   4  years ago Small fiber neuropathy   [facility] [place] - A Dept Of [place]. [facility] Theotis Haze ORN, NP       Future Appointments             In 2 months Kate Lonni CROME, MD [facility] HeartCare at [facility] A Dept of The [place]. [facility], H&V            Passed - Last BP in normal range    BP Readings from Last 1 Encounters:  08/29/24 111/81         Passed - Last Heart Rate in normal range    Pulse Readings from Last 1 Encounters:  08/29/24 82          montelukast  (SINGULAIR ) 10 MG tablet 90 tablet 1    Sig: Take 1 tablet (10 mg total) by mouth at bedtime.     Pulmonology:  Leukotriene Inhibitors Failed - 10/25/2024 12:33 PM      Failed - Valid encounter within last 12 months    Recent Outpatient Visits           3 years ago Tinea capitis   [place] [facility] - A Dept Of [place]. [facility] Delbert Clam, MD   3 years ago Dizziness   [place] [facility] Millersburg - A Dept Of [place]. [facility] [place], [place], [place]   3 years ago History of seizures   [place] [facility] Laurinburg - A Dept Of Ashley. [facility] Theotis Haze ORN, NP   4 years ago Hair loss   [place] [facility] Thornhill - A Dept Of [place]. [facility], [place], [place]   4 years ago Small fiber neuropathy   [facility] [facility] Reedsville - A Dept Of [place]. [facility] Theotis Haze ORN, NP       Future Appointments             In 2 months Kate Lonni CROME, MD [facility] HeartCare at [facility] A Dept of The [place]. [facility], H&V             olopatadine (PATANOL) 0.1 % ophthalmic solution 5 mL     Sig: Place 1 drop into both eyes 2 (two) times daily.     Ophthalmology:  Antiallergy Failed - 10/25/2024 12:33 PM      Failed - Valid encounter within last 12 months    Recent Outpatient Visits           3 years ago Tinea capitis   [place] [facility] - A  Dept Of Lupton. [facility] Delbert Clam, MD   3 years ago  Dizziness   [place]right [facility] Blackwells Mills - A Dept Of [place]. [facility] [place], [place], [place]   3 years ago History of seizures   [place] [facility] Deercroft - A Dept Of [place]. [facility] Theotis Haze ORN, NP   4 years ago Hair loss   [place] [facility] Coldwater - A Dept Of Three [place]. [facility], [place], [place]   4 years ago Small fiber neuropathy   [facility] Hingham - A Dept Of [place]. [facility] Theotis Haze ORN, NP       Future Appointments             In 2 months Kate Lonni CROME, MD [facility] Heart[facility] at [facility] A Dept of The [place]. [facility], H&V             sertraline  (ZOLOFT ) 100 MG tablet      Sig: Take 2 tablets (200 mg total) by mouth daily.     Psychiatry:  Antidepressants - SSRI - sertraline  Failed - 10/25/2024 12:33 PM      Failed - AST in normal range and within 360 days    AST  Date Value Ref Range Status  11/25/2020 66 (H) 15 - 41 U/L Final         Failed - ALT in normal range and within 360 days    ALT  Date Value Ref Range Status  11/25/2020 49 (H) 0 - 44 U/L Final         Failed - Valid encounter within last 6 months    Recent Outpatient Visits           3 years ago Tinea capitis   [facility] - A Dept Of [place]. [facility] Delbert Clam, MD   3 years ago Dizziness   [place] [facility] Centre[facility] - A Dept Of Bl[place]. [facility] [place], [place], [place]   3 years ago History of seizures   [facility] Preston - A Dept Of [place]. [facility] Theotis Haze ORN, NP   4 years ago Hair loss   [facility] [facility] Braddock - A Dept Of [place]. [facility], [place], [place]   4 years ago Small fiber neuropathy   [place] [facility] Randall - A Dept Of [place]. [facility] Theotis Haze ORN,  NP       Future Appointments             In 2 months Kate Lonni CROME, MD [facility] Heart[facility] at [facility] A Dept of The [place]. [facility], H&V            Passed - Completed PHQ-2 or PHQ-9 in the last 360 days       carboxymethylcellulose (REFRESH PLUS) 0.5 % SOLN      Sig: Place 2 drops into both eyes in the morning and at bedtime.     Ophthalmology:  Lubricants Failed - 10/25/2024 12:33 PM      Failed - Valid encounter within last 12 months    Recent Outpatient Visits           3 years ago Tinea capitis   [place] [facility] - A Dept Of [place]. [facility] Delbert Clam, MD   3 years ago Dizziness   [place]  [facility] Boeing - A Dept Of [place]. [facility] [place], [place], [place]   3 years ago History of seizures   [place] [facility] Union Hill-[facility] - A Dept Of [place]. [facility] Theotis Haze ORN, NP   4 years ago Hair loss   [facility] [facility] Granite City - A Dept Of [place]ford. [facility], [place], [place]   4 years ago Small fiber neuropathy   [place] [facility] Aneta - A Dept Of Glen[place]. [facility] Theotis Haze ORN, NP       Future Appointments             In 2 months Kate Lonni CROME, MD [facility] HeartCare at [facility] A Dept of The [place]. [facility], H&V             pantoprazole  (PROTONIX ) 40 MG tablet 90 tablet 1    Sig: Take 1 tablet (40 mg total) by mouth daily.     Gastroenterology: Proton Pump Inhibitors Failed - 10/25/2024 12:33 PM      Failed - Valid encounter within last 12 months    Recent Outpatient Visits           3 years ago Tinea capitis   [place] [facility] - A Dept Of Bow[place]. [facility] Delbert Clam, MD   3 years ago Dizziness   [facility] [facility] Thornton - A Dept Of [place]. [facility] [place], [place], [place]   3 years ago History of seizures   [place] [facility] Potrero - A Dept Of Moses  H. [facility] Theotis Haze ORN, NP   4 years ago Hair loss   [place] Seneca - A Dept Of [place]. [facility], [place], [place]   4 years ago Small fiber neuropathy   [facility] [facility] Stone City - A Dept Of . [facility] Theotis Haze ORN, NP       Future Appointments             In 2 months Kate Lonni CROME, MD [facility] HeartCare at [facility] A Dept of The [place]. [facility], H&V

## 2024-10-31 ENCOUNTER — Other Ambulatory Visit: Payer: Self-pay

## 2024-10-31 DIAGNOSIS — K219 Gastro-esophageal reflux disease without esophagitis: Secondary | ICD-10-CM

## 2024-11-01 NOTE — Telephone Encounter (Signed)
 Pt of Dr. Kate. These RX's were not mentioned in last Dr's note that I could see. Both were last refilled years ago or are historical. Does Dr. Kate want to remain on this regimen? Please advise.

## 2024-11-15 ENCOUNTER — Ambulatory Visit: Payer: Self-pay | Admitting: General Surgery

## 2024-11-15 DIAGNOSIS — Z01818 Encounter for other preprocedural examination: Secondary | ICD-10-CM

## 2024-11-20 ENCOUNTER — Encounter: Admitting: Skilled Nursing Facility1

## 2024-11-27 ENCOUNTER — Encounter

## 2024-11-30 ENCOUNTER — Encounter: Payer: Self-pay | Admitting: Dietician

## 2024-11-30 ENCOUNTER — Encounter: Attending: General Surgery | Admitting: Dietician

## 2024-11-30 VITALS — Ht 64.0 in | Wt 288.8 lb

## 2024-11-30 DIAGNOSIS — Z713 Dietary counseling and surveillance: Secondary | ICD-10-CM | POA: Insufficient documentation

## 2024-11-30 DIAGNOSIS — E119 Type 2 diabetes mellitus without complications: Secondary | ICD-10-CM | POA: Insufficient documentation

## 2024-11-30 DIAGNOSIS — E669 Obesity, unspecified: Secondary | ICD-10-CM | POA: Insufficient documentation

## 2024-11-30 DIAGNOSIS — Z6841 Body Mass Index (BMI) 40.0 and over, adult: Secondary | ICD-10-CM | POA: Insufficient documentation

## 2024-11-30 NOTE — Progress Notes (Signed)
 Pre-Operative Nutrition Education    Start Time: 1034   End Time: 1145  Patient was seen on 11/30/2024 for Pre-Operative Bariatric Surgery Education at the [facility].    Surgery date: 12/11/2024 Surgery type: Gastric By-Pass  Anthropometrics  Start weight at [facility]: 295.8 lbs (date: 05/29/2024)  Height: 64 in Weight today: 288.8 lbs BMI: 49.57 kg/m2     Clinical   Medical hx: pre-diabetes; sleep apnea; stroke Medications: see list  Labs: A1c 5.7; glucose 105; chol 229; LDL 151; iron saturation 13; Vit D 23.9 Notable signs/symptoms: pt arrived in a motorized chair/cart Suplements: Vit B12, Vit D Any previous deficiencies? No  Samples given per MNT protocol. Patient educated on appropriate usage: Celebrate Vitamins Multivitamin Lot # [id number] Exp: 03/2025  Celebrate Vitamins Calcium   Lot # [id number] Exp: 06/26  Protein Shake (Ensure Max Protein) Lot # [id number]; [id number] Exp: 02 Jun 2025  The following the learning objectives were met by the patient during this course: Identify Pre-Op Dietary Goals and will begin 2 weeks pre-operatively Identify appropriate sources of fluids and proteins  State protein recommendations and appropriate sources pre and post-operatively Identify Post-Operative Dietary Goals and will follow for 2 weeks post-operatively Identify appropriate multivitamin, calcium , and thiamin sources Describe the need for physical activity post-operatively and will follow MD recommendations State when to call healthcare provider regarding medication questions or post-operative complications When having a diagnosis of diabetes understanding hypoglycemia symptoms and the inclusion of 1 complex carbohydrate per meal  Handouts given during class include: Pre-Op Bariatric Surgery Diet Handout Protein Shake Handout Post-Op Bariatric Surgery Nutrition Handout BELT Program Information Flyer [facility] Information Flyer [facility] Outpatient  Pharmacy Bariatric Supplements Price List  Follow-Up Plan: Patient will follow-up at [facility] 2 weeks post operatively for diet advancement per MD.

## 2024-12-01 ENCOUNTER — Encounter (HOSPITAL_COMMUNITY): Payer: Self-pay

## 2024-12-01 NOTE — Progress Notes (Signed)
 Date of COVID positive in last 90 days:  [facility] Cardiologist - Medford Nanas, MD  Chest x-ray - 05-31-24 Epic EKG - 08-29-24 Epic Stress Test - N/A ECHO - 04-23-16 Epic Cardiac Cath - N/A Long Term Monitor - 2025 Epic Pacemaker/ICD device last checked:N/A Spinal Cord Stimulator:N/A  Bowel Prep - N/A  Sleep Study - Yes, +sleep apnea CPAP -   Prediabetes Fasting Blood Sugar - N/A Checks Blood Sugar _____ times a day  Last dose of GLP1 agonist-  N/A GLP1 instructions:  Do not take after     Last dose of SGLT-2 inhibitors-  N/A SGLT-2 instructions:  Do not take after     Blood Thinner Instructions: N/A Last dose:   Time: Aspirin  Instructions:N/A Last Dose:  Activity level:  Can go up a flight of stairs and perform activities of daily living without stopping and without symptoms of chest pain or shortness of breath.  Able to exercise without symptoms  Unable to go up a flight of stairs without symptoms of     Anesthesia review:   Chest pain and DOE evaluated by cardiology,  HTN, OSA, DM  Patient denies shortness of breath, fever, cough and chest pain at PAT appointment  Patient verbalized understanding of instructions that were given to them at the PAT appointment. Patient was also instructed that they will need to review over the PAT instructions again at home before surgery.

## 2024-12-05 ENCOUNTER — Encounter (HOSPITAL_COMMUNITY): Admission: RE | Admit: 2024-12-05 | Source: Ambulatory Visit

## 2024-12-05 HISTORY — DX: Prediabetes: R73.03

## 2024-12-07 NOTE — Patient Instructions (Addendum)
 SURGICAL WAITING ROOM VISITATION Patients having surgery or a procedure may have no more than 2 support people in the waiting area - these visitors may rotate in the visitor waiting room.   Due to an increase in RSV and influenza rates and associated hospitalizations, children ages 40 and under may not visit patients in [facility] hospitals. If the patient needs to stay at the hospital during part of their recovery, the visitor guidelines for inpatient rooms apply.  PRE-OP VISITATION  Pre-op nurse will coordinate an appropriate time for 1 support person to accompany the patient in pre-op.  This support person may not rotate.  This visitor will be contacted when the time is appropriate for the visitor to come back in the pre-op area.  Please refer to the [facility] website for the visitor guidelines for Inpatients (after your surgery is over and you are in a regular room).  You are not required to quarantine at this time prior to your surgery. However, you must do this: Hand Hygiene often Do NOT share personal items Notify your provider if you are in close contact with someone who has COVID or you develop fever 100.4 or greater, new onset of sneezing, cough, sore throat, shortness of breath or body aches.  If you test positive for Covid or have been in contact with anyone that has tested positive in the last 10 days please notify you surgeon.    Your procedure is scheduled on:  12/11/24  Report to [facility] Main Entrance: [place] entrance where the [employer] is available.   Report to admitting at: 5:15 AM  Call this number if you have any questions or problems the morning of surgery [phone]  FOLLOW ANY ADDITIONAL PRE OP INSTRUCTIONS YOU RECEIVED FROM YOUR SURGEON'S OFFICE!!!  Do not eat food after 6:00 PM the night prior to your surgery/procedure.  After 6:00 PM the day before surgery you may have the following liquids until : 4:30 AM DAY OF SURGERY  Clear Liquid  Diet Water Black Coffee (sugar ok, NO MILK/CREAM OR CREAMERS)  Tea (sugar ok, NO MILK/CREAM OR CREAMERS) regular and decaf                             Plain Jell-O  with no fruit (NO RED)                                           Fruit ices (not with fruit pulp, NO RED)                                     Popsicles (NO RED)                                                                  Juice: NO CITRUS JUICES: only apple, WHITE grape, WHITE cranberry Sports drinks like Gatorade or Powerade (NO RED)   The day of surgery:  Drink ONE (1) Pre-Surgery Clear G2 at : 4:30 AM the morning of surgery. Drink in one sitting. Do  not sip.  This drink was given to you during your hospital pre-op appointment visit. Nothing else to drink after completing the Pre-Surgery Clear Ensure or G2 : No candy, chewing gum or throat lozenges.    MORNING OF SURGERY DRINK:   DRINK 1 G2 drink BEFORE YOU LEAVE HOME, DRINK ALL OF THE  G2 DRINK AT ONE TIME.   NO SOLID FOOD AFTER 600 PM THE NIGHT BEFORE YOUR SURGERY. YOU MAY DRINK CLEAR FLUIDS. THE G2 DRINK YOU DRINK BEFORE YOU LEAVE HOME WILL BE THE LAST FLUIDS YOU DRINK BEFORE SURGERY.  PAIN IS EXPECTED AFTER SURGERY AND WILL NOT BE COMPLETELY ELIMINATED. AMBULATION AND TYLENOL  WILL HELP REDUCE INCISIONAL AND GAS PAIN. MOVEMENT IS KEY!  YOU ARE EXPECTED TO BE OUT OF BED WITHIN 4 HOURS OF ADMISSION TO YOUR PATIENT ROOM.  SITTING IN THE RECLINER THROUGHOUT THE DAY IS IMPORTANT FOR DRINKING FLUIDS AND MOVING GAS THROUGHOUT THE GI TRACT.  COMPRESSION STOCKINGS SHOULD BE WORN [facility] STAY UNLESS YOU ARE WALKING.   INCENTIVE SPIROMETER SHOULD BE USED EVERY HOUR WHILE AWAKE TO DECREASE POST-OPERATIVE COMPLICATIONS SUCH AS PNEUMONIA.  WHEN DISCHARGED HOME, IT IS IMPORTANT TO CONTINUE TO WALK EVERY HOUR AND USE THE INCENTIVE SPIROMETER EVERY HOUR.   Oral Hygiene is also important to reduce your risk of infection.        Remember - BRUSH YOUR TEETH THE  MORNING OF SURGERY WITH YOUR REGULAR TOOTHPASTE  Do NOT smoke after Midnight the night before surgery.  STOP TAKING all Vitamins, Herbs and supplements 1 week before your surgery.   Take ONLY these medicines the morning of surgery with A SIP OF WATER:carvedilol ,levetiracetam ,pantoprazole ,sertraline .Tylenol  as needed.Use eye drops and inhalers as usual,bring the inhalers.   If You have been diagnosed with Sleep Apnea - Bring CPAP mask and tubing day of surgery. We will provide you with a CPAP machine on the day of your surgery.                   You may not have any metal on your body including hair pins, jewelry, and body piercing  Do not wear make-up, lotions, powders, perfumes / cologne, or deodorant  Do not wear nail polish including gel and S&S, artificial / acrylic nails, or any other type of covering on natural nails including finger and toenails. If you have artificial nails, gel coating, etc., that needs to be removed by a nail salon, Please have this removed prior to surgery. Not doing so may mean that your surgery could be cancelled or delayed if the Surgeon or anesthesia staff feels like they are unable to monitor you safely.   Do not shave 48 hours prior to surgery to avoid nicks in your skin which may contribute to postoperative infections.   Contacts, Hearing Aids, dentures or bridgework may not be worn into surgery. DENTURES WILL BE REMOVED PRIOR TO SURGERY PLEASE DO NOT APPLY Poly grip OR ADHESIVES!!!  You may bring a small overnight bag with you on the day of surgery, only pack items that are not valuable. [facility] IS NOT RESPONSIBLE   FOR VALUABLES THAT ARE LOST OR STOLEN.   Patients discharged on the day of surgery will not be allowed to drive home.  Someone NEEDS to stay with you for the first 24 hours after anesthesia.  Do not bring your home medications to the hospital. The Pharmacy will dispense medications listed on your medication list to you during your  admission in the Hospital.  Special Instructions: Bring a copy of your healthcare power of attorney and living will documents the day of surgery, if you wish to have them scanned into your [facility]- EPIC  Please read over the following fact sheets you were given: IF YOU HAVE QUESTIONS ABOUT YOUR PRE-OP INSTRUCTIONS, PLEASE CALL [phone]   [facility] - Preparing for Surgery      Before surgery, you can play an important role.  Because skin is not sterile, your skin needs to be as free of germs as possible.  You can reduce the number of germs on your skin by washing with CHG (chlorahexidine gluconate) soap before surgery.  CHG is an antiseptic cleaner which kills germs and bonds with the skin to continue killing germs even after washing. Please DO NOT use if you have an allergy to CHG or antibacterial soaps.  If your skin becomes reddened/irritated stop using the CHG and inform your nurse when you arrive at Short Stay. Do not shave (including legs and underarms) for at least 48 hours prior to the first CHG shower.  You may shave your face/neck.  Please follow these instructions carefully:  1.  Shower with CHG Soap the night before surgery ONLY (DO NOT USE THE SOAP THE MORNING OF SURGERY).  2.  If you choose to wash your hair, wash your hair first as usual with your normal  shampoo.  3.  After you shampoo, rinse your hair and body thoroughly to remove the shampoo.                             4.  Use CHG as you would any other liquid soap.  You can apply chg directly to the skin and wash.  Gently with a scrungie or clean washcloth.  5.  Apply the CHG Soap to your body ONLY FROM THE NECK DOWN.   Do not use on face/ open                           Wound or open sores. Avoid contact with eyes, ears mouth and genitals (private parts).                       Wash face,  Genitals (private parts) with your normal soap.             6.  Wash thoroughly, paying special attention to the  area where your  surgery  will be performed.  7.  Thoroughly rinse your body with warm water from the neck down.  8.  DO NOT shower/wash with your normal soap after using and rinsing off the CHG Soap.                9.  Pat yourself dry with a clean towel.            10.  Wear clean pajamas.            11.  Place clean sheets on your bed the night of your first shower and do not  sleep with pets.  Day of Surgery : Do not apply any CHG, lotions/deodorants the morning of surgery.  Please wear clean clothes to the hospital/surgery center.   FAILURE TO FOLLOW THESE INSTRUCTIONS MAY RESULT IN THE CANCELLATION OF YOUR SURGERY  PATIENT SIGNATURE_________________________________  NURSE SIGNATURE__________________________________  ________________________________________________________________________

## 2024-12-08 ENCOUNTER — Telehealth: Payer: Self-pay | Admitting: Cardiology

## 2024-12-08 ENCOUNTER — Encounter (HOSPITAL_COMMUNITY): Admission: RE | Admit: 2024-12-08

## 2024-12-08 ENCOUNTER — Encounter (HOSPITAL_COMMUNITY): Payer: Self-pay

## 2024-12-08 ENCOUNTER — Other Ambulatory Visit: Payer: Self-pay

## 2024-12-08 VITALS — BP 118/66 | HR 69 | Temp 98.7°F | Ht 64.0 in | Wt 286.0 lb

## 2024-12-08 DIAGNOSIS — Z01818 Encounter for other preprocedural examination: Secondary | ICD-10-CM

## 2024-12-08 DIAGNOSIS — I10 Essential (primary) hypertension: Secondary | ICD-10-CM

## 2024-12-08 HISTORY — DX: Essential (primary) hypertension: I10

## 2024-12-08 HISTORY — DX: Cardiac arrhythmia, unspecified: I49.9

## 2024-12-08 HISTORY — DX: Anxiety disorder, unspecified: F41.9

## 2024-12-08 HISTORY — DX: Depression, unspecified: F32.A

## 2024-12-08 HISTORY — DX: Unspecified osteoarthritis, unspecified site: M19.90

## 2024-12-08 LAB — TYPE AND SCREEN
ABO/RH(D): O POS
Antibody Screen: NEGATIVE

## 2024-12-08 LAB — CBC WITH DIFFERENTIAL/PLATELET
Abs Immature Granulocytes: 0.01 10*3/uL (ref 0.00–0.07)
Basophils Absolute: 0 10*3/uL (ref 0.0–0.1)
Basophils Relative: 0 %
Eosinophils Absolute: 0.2 10*3/uL (ref 0.0–0.5)
Eosinophils Relative: 3 %
HCT: 42.2 % (ref 36.0–46.0)
Hemoglobin: 13.8 g/dL (ref 12.0–15.0)
Immature Granulocytes: 0 %
Lymphocytes Relative: 48 %
Lymphs Abs: 2.9 10*3/uL (ref 0.7–4.0)
MCH: 26.8 pg (ref 26.0–34.0)
MCHC: 32.7 g/dL (ref 30.0–36.0)
MCV: 81.9 fL (ref 80.0–100.0)
Monocytes Absolute: 0.4 10*3/uL (ref 0.1–1.0)
Monocytes Relative: 6 %
Neutro Abs: 2.5 10*3/uL (ref 1.7–7.7)
Neutrophils Relative %: 43 %
Platelets: 344 10*3/uL (ref 150–400)
RBC: 5.15 MIL/uL — ABNORMAL HIGH (ref 3.87–5.11)
RDW: 16.2 % — ABNORMAL HIGH (ref 11.5–15.5)
WBC: 5.9 10*3/uL (ref 4.0–10.5)
nRBC: 0 % (ref 0.0–0.2)

## 2024-12-08 NOTE — Telephone Encounter (Signed)
 Patient was seen last by Dr. Kate with chest pain. He was to have cardiac PET scan. This was not completed by patient and she has not had a follow up.  She will need an urgent in office appointment for surgical clearance.

## 2024-12-08 NOTE — Progress Notes (Signed)
 " Case: [id number] Date/Time: 12/11/24 0715   Procedures:      CREATION, GASTRIC BYPASS, LAPAROSCOPIC WITH ROUX-EN-Y GASTROENTEROSTOMY     ENDOSCOPY, UPPER GI TRACT   Anesthesia type: General   Pre-op diagnosis: MORBID OBESITY   Location: [facility] ROOM 01 / [facility]   Surgeons: Kinsinger, Herlene Righter, MD       DISCUSSION: Holly Cook is a 59 yo female with PMH of HTN, CAD, OSA, hx of CVA, DM, anxiety, depression, arthritis, obesity (BMI 49).   Patient evaluated by [facility] cardiology on 08/29/2024 for chest pain with associated palpitations.  Stress test, heart monitoring, echo ordered. Heart monitoring completed and was without significant arrhythmia. Echo and ST not completed. Cardiac cx requested    VS: BP 118/66   Pulse 69   Temp 37.1 C (Oral)   Ht 5' 4 (1.626 m)   Wt 129.7 kg   SpO2 98%   BMI 49.09 kg/m   PROVIDERS: [facility]   LABS: Labs reviewed: Acceptable for surgery. (all labs ordered are listed, but only abnormal results are displayed)  Labs Reviewed  CBC WITH DIFFERENTIAL/PLATELET - Abnormal; Notable for the following components:      Result Value   RBC 5.15 (*)    RDW 16.2 (*)    All other components within normal limits  TYPE AND SCREEN      Event monitor 09/26/24:  Patch Wear Time:  13 days and 23 hours (2025-11-01T10:14:03-399 to 2025-11-15T09:14:03-499)   Patient had a min HR of 54 bpm, max HR of 145 bpm, and avg HR of 79 bpm. Predominant underlying rhythm was Sinus Rhythm. 2 Supraventricular Tachycardia runs occurred, the run with the fastest interval lasting 11 beats with a max rate of 145 bpm (avg 117  bpm); the run with the fastest interval was also the longest. Isolated SVEs were rare (<1.0%), SVE Couplets were rare (<1.0%), and SVE Triplets were rare (<1.0%). No Isolated VEs, VE Couplets, or VE Triplets were present.   Past Medical History:  Diagnosis Date   Anxiety    Arthritis    CAD (coronary artery disease)    Depression     Diabetes mellitus without complication (HCC)    patient states prediabetic   Dysrhythmia    Hypertension    Pre-diabetes    Sleep apnea    Stroke (HCC) 2019   slight stroke     Past Surgical History:  Procedure Laterality Date   BACK SURGERY     lumbar spine   COLONOSCOPY     HERNIA REPAIR     wrist and arm surgery Right     MEDICATIONS:  acetaminophen  (TYLENOL ) 500 MG tablet   albuterol  (VENTOLIN  HFA) 108 (90 Base) MCG/ACT inhaler   aspirin  EC 81 MG tablet   atorvastatin  (LIPITOR) 20 MG tablet   carboxymethylcellulose (REFRESH PLUS) 0.5 % SOLN   carvedilol  (COREG ) 25 MG tablet   Cholecalciferol (VITAMIN D3 PO)   cyclobenzaprine  (FLEXERIL ) 10 MG tablet   levETIRAcetam  (KEPPRA ) 500 MG tablet   Lifitegrast 5 % SOLN   montelukast  (SINGULAIR ) 10 MG tablet   olopatadine (PATANOL) 0.1 % ophthalmic solution   pantoprazole  (PROTONIX ) 40 MG tablet   Selenium  Sulfide 2.25 % SHAM   sertraline  (ZOLOFT ) 100 MG tablet   terbinafine  (LAMISIL ) 250 MG tablet   traZODone  (DESYREL ) 100 MG tablet   triamcinolone  cream (KENALOG ) 0.1 %   No current facility-administered medications for this encounter.    Burnard CHRISTELLA Senna, PA-C [facility] Pre-Surgical Testing [facility] Phone (  [phone] 12/08/2024 1:53 PM       "

## 2024-12-08 NOTE — Telephone Encounter (Signed)
 Caller Juris) stated Urgent Clearance request

## 2024-12-08 NOTE — Progress Notes (Signed)
 PCP - [facility] Cardiologist - Medford Nanas, MD. LOV: 08/29/24   Chest x-ray - 05-31-24 Epic EKG - 08-29-24 Epic Stress Test - N/A ECHO - 04-23-16 Epic Cardiac Cath - N/A Long Term Monitor - 2025 Epic Pacemaker/ICD device last checked:N/A Spinal Cord Stimulator:N/A   Bowel Prep - N/A   Sleep Study - Yes, +sleep apnea CPAP - Yes   Prediabetes Fasting Blood Sugar - N/A Checks Blood Sugar __0___ times a day  Last A1C: 5.7: 08/18/24  Last dose of GLP1 agonist-  N/A GLP1 instructions:  Do not take after     Last dose of SGLT-2 inhibitors-  N/A SGLT-2 instructions:  Do not take after       Blood Thinner Instructions: N/A Last dose:                Time: Aspirin  Instructions: ON hold. Last Dose:   Activity level:     Unable to go up a flight of stairs without symptoms of   SOB.                      Anesthesia review:   Chest pain and DOE evaluated by cardiology,  HTN, OSA, DM,CAD,Stroke.   Patient denies shortness of breath, fever, cough and chest pain at PAT appointment   Patient verbalized understanding of instructions that were given to them at the PAT appointment. Patient was also instructed that they will need to review over the PAT instructions again at home before surgery.

## 2024-12-08 NOTE — Telephone Encounter (Signed)
"  ° °  Pre-operative Risk Assessment    Patient Name: Holly Cook  DOB: [birth year] MRN: [id number]   Date of last office visit: 08/29/24 Date of next office visit: 12/27/23   Request for Surgical Clearance    Procedure:  Laproscopy Gastric Bypass, Upper GI Endo  Date of Surgery:  Clearance 12/11/24                                Surgeon:  Dr. Herlene Bureau Surgeon's Group or Practice Name:  [facility] Phone number:  [phone]  Fax number:  [phone]   Type of Clearance Requested:   - Medical    Type of Anesthesia:  General    Additional requests/questions:  Caller Juris) noted this is an Urgent clearance request.    Signed, Holly Cook   12/08/2024, 1:42 PM   "

## 2024-12-08 NOTE — Telephone Encounter (Signed)
 After discussing with the preop APP Orren Fabry, PAC and Lamarr Satterfield, DNP about appt in office for pt, see notes. Surgery planned for 12/11/24. I explained that I have reviewed both [facility] and [address] [facility] as the pt would need to come in today for 12/11/24 surgery.   I called the surgeon office and about the pt needs to be seen in the office as well as she was ordered by Dr. Kate on his last visit for her to have a cardiac PET scan for chest pain, which was not done.   I informed the office about the staff message that I was sent; see below. Office staff tells me that they will post pone until the pt has been seen and cleared by her cardiologist. I assured the office that I will send notes over from today to keep them in the loop.     I also explained that I was contacted by requesting office by staff message: see notes: Holly Cook M, CMA  Holly Cook afternoon [place],  In my review of your message ; lets get you the right fax#. The fax # you are using has not been in process for the last yr and 1/2 as we have gone to electronic faxes. The correct fax # [phone], attn: preop team.  The next thing I see the pt did need appt in office and has appt 12/26/24 with Dr. Kate.  I hope this helps you. Looks like you have appt same day as well 12/26/24.  Thank you Holly Cook team       Previous Messages    ----- Message ----- From: Holly Jhonnie Sent: 12/08/2024   1:13 PM EST To: Sari Sharps; Holly CHRISTELLA Jest, CMA Subject: Urgent Cardiac Clearance Requested            Good afternoon Holly,  I hate to bother you but this pt is needing urgent cardiac clearance for sx w/ Dr. Stevie scheduled for this coming Monday, 12/11/2024 @ 7:30am. I reached out to your office earlier and spoke with someone named Holly Cook. I tried to explained that Dr. Kate had ordered a stress test, ECHO and heart monitor back in Oct. Pt did the heart monitor but didn't do neither the stress test or ECHO.  So I asked if the pt would need an office visit to receive clearance and she stated she didn't/wouldn't know until they received the clearance request. I've been trying to fax the urgent clearance request to the fax number [phone]) Holly Cook gave me since about 11:00am w/ no success. It just keeps saying Busy. Could you please possibly help w/ this or point me in the right direction.   Thanks, Holly Cook, Triage LPN

## 2024-12-11 ENCOUNTER — Encounter (HOSPITAL_COMMUNITY): Payer: Self-pay | Admitting: Certified Registered"

## 2024-12-11 ENCOUNTER — Encounter (HOSPITAL_COMMUNITY): Payer: Self-pay | Admitting: Medical

## 2024-12-11 ENCOUNTER — Inpatient Hospital Stay (HOSPITAL_COMMUNITY): Admission: RE | Admit: 2024-12-11 | Source: Home / Self Care | Admitting: General Surgery

## 2024-12-11 ENCOUNTER — Encounter (HOSPITAL_COMMUNITY): Admission: RE | Payer: Self-pay | Source: Home / Self Care

## 2024-12-26 ENCOUNTER — Encounter

## 2024-12-26 ENCOUNTER — Ambulatory Visit: Admitting: Cardiology
